# Patient Record
Sex: Female | Born: 1975
Health system: Southern US, Community
[De-identification: ages and names within clinical notes are randomized; demographics above are authoritative.]

## PROBLEM LIST (undated history)

## (undated) DIAGNOSIS — E079 Disorder of thyroid, unspecified: Secondary | ICD-10-CM

## (undated) HISTORY — DX: Gilbert syndrome: E80.4

## (undated) HISTORY — PX: SEPTOPLASTY: SUR1290

## (undated) HISTORY — DX: Disorder of thyroid, unspecified: E07.9

---

## 2002-12-12 HISTORY — PX: TUBAL LIGATION: SHX77

## 2003-12-09 ENCOUNTER — Ambulatory Visit (HOSPITAL_COMMUNITY): Admission: RE | Admit: 2003-12-09 | Discharge: 2003-12-09 | Payer: Self-pay | Admitting: *Deleted

## 2010-12-12 HISTORY — PX: LAMINECTOMY: SHX219

## 2016-02-09 DIAGNOSIS — N393 Stress incontinence (female) (male): Secondary | ICD-10-CM | POA: Insufficient documentation

## 2016-02-09 DIAGNOSIS — R002 Palpitations: Secondary | ICD-10-CM | POA: Insufficient documentation

## 2016-02-09 DIAGNOSIS — R51 Headache: Secondary | ICD-10-CM

## 2016-02-09 DIAGNOSIS — R519 Headache, unspecified: Secondary | ICD-10-CM | POA: Insufficient documentation

## 2016-12-26 DIAGNOSIS — M542 Cervicalgia: Secondary | ICD-10-CM | POA: Diagnosis not present

## 2016-12-26 DIAGNOSIS — G441 Vascular headache, not elsewhere classified: Secondary | ICD-10-CM | POA: Diagnosis not present

## 2016-12-26 DIAGNOSIS — M5030 Other cervical disc degeneration, unspecified cervical region: Secondary | ICD-10-CM | POA: Diagnosis not present

## 2016-12-26 DIAGNOSIS — M9901 Segmental and somatic dysfunction of cervical region: Secondary | ICD-10-CM | POA: Diagnosis not present

## 2017-01-02 DIAGNOSIS — R7309 Other abnormal glucose: Secondary | ICD-10-CM | POA: Diagnosis not present

## 2017-01-10 DIAGNOSIS — Z124 Encounter for screening for malignant neoplasm of cervix: Secondary | ICD-10-CM | POA: Diagnosis not present

## 2017-01-10 DIAGNOSIS — Z113 Encounter for screening for infections with a predominantly sexual mode of transmission: Secondary | ICD-10-CM | POA: Diagnosis not present

## 2017-01-10 DIAGNOSIS — Z6832 Body mass index (BMI) 32.0-32.9, adult: Secondary | ICD-10-CM | POA: Diagnosis not present

## 2017-01-10 DIAGNOSIS — N921 Excessive and frequent menstruation with irregular cycle: Secondary | ICD-10-CM | POA: Diagnosis not present

## 2017-01-10 DIAGNOSIS — Z1151 Encounter for screening for human papillomavirus (HPV): Secondary | ICD-10-CM | POA: Diagnosis not present

## 2017-01-10 DIAGNOSIS — Z01419 Encounter for gynecological examination (general) (routine) without abnormal findings: Secondary | ICD-10-CM | POA: Diagnosis not present

## 2017-01-12 DIAGNOSIS — G441 Vascular headache, not elsewhere classified: Secondary | ICD-10-CM | POA: Diagnosis not present

## 2017-01-12 DIAGNOSIS — M9901 Segmental and somatic dysfunction of cervical region: Secondary | ICD-10-CM | POA: Diagnosis not present

## 2017-01-12 DIAGNOSIS — M542 Cervicalgia: Secondary | ICD-10-CM | POA: Diagnosis not present

## 2017-01-12 DIAGNOSIS — M5030 Other cervical disc degeneration, unspecified cervical region: Secondary | ICD-10-CM | POA: Diagnosis not present

## 2017-01-19 DIAGNOSIS — M5030 Other cervical disc degeneration, unspecified cervical region: Secondary | ICD-10-CM | POA: Diagnosis not present

## 2017-01-19 DIAGNOSIS — M542 Cervicalgia: Secondary | ICD-10-CM | POA: Diagnosis not present

## 2017-01-19 DIAGNOSIS — M9901 Segmental and somatic dysfunction of cervical region: Secondary | ICD-10-CM | POA: Diagnosis not present

## 2017-01-19 DIAGNOSIS — G441 Vascular headache, not elsewhere classified: Secondary | ICD-10-CM | POA: Diagnosis not present

## 2017-01-26 DIAGNOSIS — G441 Vascular headache, not elsewhere classified: Secondary | ICD-10-CM | POA: Diagnosis not present

## 2017-01-26 DIAGNOSIS — M5030 Other cervical disc degeneration, unspecified cervical region: Secondary | ICD-10-CM | POA: Diagnosis not present

## 2017-01-26 DIAGNOSIS — M542 Cervicalgia: Secondary | ICD-10-CM | POA: Diagnosis not present

## 2017-01-26 DIAGNOSIS — M9901 Segmental and somatic dysfunction of cervical region: Secondary | ICD-10-CM | POA: Diagnosis not present

## 2017-01-30 DIAGNOSIS — M542 Cervicalgia: Secondary | ICD-10-CM | POA: Diagnosis not present

## 2017-01-30 DIAGNOSIS — M9901 Segmental and somatic dysfunction of cervical region: Secondary | ICD-10-CM | POA: Diagnosis not present

## 2017-01-30 DIAGNOSIS — M5030 Other cervical disc degeneration, unspecified cervical region: Secondary | ICD-10-CM | POA: Diagnosis not present

## 2017-01-30 DIAGNOSIS — G441 Vascular headache, not elsewhere classified: Secondary | ICD-10-CM | POA: Diagnosis not present

## 2017-02-09 DIAGNOSIS — M9901 Segmental and somatic dysfunction of cervical region: Secondary | ICD-10-CM | POA: Diagnosis not present

## 2017-02-09 DIAGNOSIS — G441 Vascular headache, not elsewhere classified: Secondary | ICD-10-CM | POA: Diagnosis not present

## 2017-02-09 DIAGNOSIS — M542 Cervicalgia: Secondary | ICD-10-CM | POA: Diagnosis not present

## 2017-02-09 DIAGNOSIS — M5030 Other cervical disc degeneration, unspecified cervical region: Secondary | ICD-10-CM | POA: Diagnosis not present

## 2017-02-17 DIAGNOSIS — G441 Vascular headache, not elsewhere classified: Secondary | ICD-10-CM | POA: Diagnosis not present

## 2017-02-17 DIAGNOSIS — M9901 Segmental and somatic dysfunction of cervical region: Secondary | ICD-10-CM | POA: Diagnosis not present

## 2017-02-17 DIAGNOSIS — M5030 Other cervical disc degeneration, unspecified cervical region: Secondary | ICD-10-CM | POA: Diagnosis not present

## 2017-02-17 DIAGNOSIS — M542 Cervicalgia: Secondary | ICD-10-CM | POA: Diagnosis not present

## 2017-02-28 DIAGNOSIS — G441 Vascular headache, not elsewhere classified: Secondary | ICD-10-CM | POA: Diagnosis not present

## 2017-02-28 DIAGNOSIS — M542 Cervicalgia: Secondary | ICD-10-CM | POA: Diagnosis not present

## 2017-02-28 DIAGNOSIS — M9901 Segmental and somatic dysfunction of cervical region: Secondary | ICD-10-CM | POA: Diagnosis not present

## 2017-02-28 DIAGNOSIS — M5030 Other cervical disc degeneration, unspecified cervical region: Secondary | ICD-10-CM | POA: Diagnosis not present

## 2017-03-14 DIAGNOSIS — M9901 Segmental and somatic dysfunction of cervical region: Secondary | ICD-10-CM | POA: Diagnosis not present

## 2017-03-14 DIAGNOSIS — M5030 Other cervical disc degeneration, unspecified cervical region: Secondary | ICD-10-CM | POA: Diagnosis not present

## 2017-03-14 DIAGNOSIS — M542 Cervicalgia: Secondary | ICD-10-CM | POA: Diagnosis not present

## 2017-03-14 DIAGNOSIS — G441 Vascular headache, not elsewhere classified: Secondary | ICD-10-CM | POA: Diagnosis not present

## 2017-03-27 DIAGNOSIS — M5030 Other cervical disc degeneration, unspecified cervical region: Secondary | ICD-10-CM | POA: Diagnosis not present

## 2017-03-27 DIAGNOSIS — M542 Cervicalgia: Secondary | ICD-10-CM | POA: Diagnosis not present

## 2017-03-27 DIAGNOSIS — M9901 Segmental and somatic dysfunction of cervical region: Secondary | ICD-10-CM | POA: Diagnosis not present

## 2017-03-27 DIAGNOSIS — G441 Vascular headache, not elsewhere classified: Secondary | ICD-10-CM | POA: Diagnosis not present

## 2017-04-06 DIAGNOSIS — M542 Cervicalgia: Secondary | ICD-10-CM | POA: Diagnosis not present

## 2017-04-06 DIAGNOSIS — G441 Vascular headache, not elsewhere classified: Secondary | ICD-10-CM | POA: Diagnosis not present

## 2017-04-06 DIAGNOSIS — M5030 Other cervical disc degeneration, unspecified cervical region: Secondary | ICD-10-CM | POA: Diagnosis not present

## 2017-04-06 DIAGNOSIS — M9901 Segmental and somatic dysfunction of cervical region: Secondary | ICD-10-CM | POA: Diagnosis not present

## 2017-04-21 DIAGNOSIS — M542 Cervicalgia: Secondary | ICD-10-CM | POA: Diagnosis not present

## 2017-04-21 DIAGNOSIS — M5030 Other cervical disc degeneration, unspecified cervical region: Secondary | ICD-10-CM | POA: Diagnosis not present

## 2017-04-21 DIAGNOSIS — G441 Vascular headache, not elsewhere classified: Secondary | ICD-10-CM | POA: Diagnosis not present

## 2017-04-21 DIAGNOSIS — M9901 Segmental and somatic dysfunction of cervical region: Secondary | ICD-10-CM | POA: Diagnosis not present

## 2017-05-09 DIAGNOSIS — G441 Vascular headache, not elsewhere classified: Secondary | ICD-10-CM | POA: Diagnosis not present

## 2017-05-09 DIAGNOSIS — M542 Cervicalgia: Secondary | ICD-10-CM | POA: Diagnosis not present

## 2017-05-09 DIAGNOSIS — M9901 Segmental and somatic dysfunction of cervical region: Secondary | ICD-10-CM | POA: Diagnosis not present

## 2017-05-09 DIAGNOSIS — M5030 Other cervical disc degeneration, unspecified cervical region: Secondary | ICD-10-CM | POA: Diagnosis not present

## 2017-05-22 DIAGNOSIS — M9901 Segmental and somatic dysfunction of cervical region: Secondary | ICD-10-CM | POA: Diagnosis not present

## 2017-05-22 DIAGNOSIS — M542 Cervicalgia: Secondary | ICD-10-CM | POA: Diagnosis not present

## 2017-05-22 DIAGNOSIS — M5030 Other cervical disc degeneration, unspecified cervical region: Secondary | ICD-10-CM | POA: Diagnosis not present

## 2017-05-22 DIAGNOSIS — G441 Vascular headache, not elsewhere classified: Secondary | ICD-10-CM | POA: Diagnosis not present

## 2017-07-12 DIAGNOSIS — R635 Abnormal weight gain: Secondary | ICD-10-CM | POA: Diagnosis not present

## 2017-07-12 DIAGNOSIS — Z0000 Encounter for general adult medical examination without abnormal findings: Secondary | ICD-10-CM | POA: Diagnosis not present

## 2017-07-12 DIAGNOSIS — Z0389 Encounter for observation for other suspected diseases and conditions ruled out: Secondary | ICD-10-CM | POA: Diagnosis not present

## 2017-07-12 DIAGNOSIS — R5383 Other fatigue: Secondary | ICD-10-CM | POA: Diagnosis not present

## 2017-07-12 DIAGNOSIS — E785 Hyperlipidemia, unspecified: Secondary | ICD-10-CM | POA: Diagnosis not present

## 2017-07-12 DIAGNOSIS — R61 Generalized hyperhidrosis: Secondary | ICD-10-CM | POA: Diagnosis not present

## 2017-07-12 DIAGNOSIS — E2839 Other primary ovarian failure: Secondary | ICD-10-CM | POA: Diagnosis not present

## 2017-08-01 DIAGNOSIS — R5383 Other fatigue: Secondary | ICD-10-CM | POA: Diagnosis not present

## 2017-08-01 DIAGNOSIS — E559 Vitamin D deficiency, unspecified: Secondary | ICD-10-CM | POA: Diagnosis not present

## 2017-08-01 DIAGNOSIS — E039 Hypothyroidism, unspecified: Secondary | ICD-10-CM | POA: Diagnosis not present

## 2017-08-01 DIAGNOSIS — R635 Abnormal weight gain: Secondary | ICD-10-CM | POA: Diagnosis not present

## 2017-09-12 DIAGNOSIS — E039 Hypothyroidism, unspecified: Secondary | ICD-10-CM | POA: Diagnosis not present

## 2017-09-12 DIAGNOSIS — R5383 Other fatigue: Secondary | ICD-10-CM | POA: Diagnosis not present

## 2017-09-12 DIAGNOSIS — E559 Vitamin D deficiency, unspecified: Secondary | ICD-10-CM | POA: Diagnosis not present

## 2017-09-18 DIAGNOSIS — N943 Premenstrual tension syndrome: Secondary | ICD-10-CM | POA: Diagnosis not present

## 2017-10-24 DIAGNOSIS — Z1231 Encounter for screening mammogram for malignant neoplasm of breast: Secondary | ICD-10-CM | POA: Diagnosis not present

## 2017-10-24 DIAGNOSIS — E039 Hypothyroidism, unspecified: Secondary | ICD-10-CM | POA: Diagnosis not present

## 2017-10-24 DIAGNOSIS — E559 Vitamin D deficiency, unspecified: Secondary | ICD-10-CM | POA: Diagnosis not present

## 2018-01-10 DIAGNOSIS — Z6835 Body mass index (BMI) 35.0-35.9, adult: Secondary | ICD-10-CM | POA: Diagnosis not present

## 2018-01-10 DIAGNOSIS — Z01419 Encounter for gynecological examination (general) (routine) without abnormal findings: Secondary | ICD-10-CM | POA: Diagnosis not present

## 2018-01-10 DIAGNOSIS — N393 Stress incontinence (female) (male): Secondary | ICD-10-CM | POA: Diagnosis not present

## 2018-01-23 DIAGNOSIS — N3941 Urge incontinence: Secondary | ICD-10-CM | POA: Diagnosis not present

## 2018-01-23 DIAGNOSIS — R3915 Urgency of urination: Secondary | ICD-10-CM | POA: Diagnosis not present

## 2018-01-23 DIAGNOSIS — N393 Stress incontinence (female) (male): Secondary | ICD-10-CM | POA: Diagnosis not present

## 2018-01-29 DIAGNOSIS — R5383 Other fatigue: Secondary | ICD-10-CM | POA: Diagnosis not present

## 2018-01-29 DIAGNOSIS — R635 Abnormal weight gain: Secondary | ICD-10-CM | POA: Diagnosis not present

## 2018-01-30 DIAGNOSIS — N3941 Urge incontinence: Secondary | ICD-10-CM | POA: Diagnosis not present

## 2018-01-30 DIAGNOSIS — R3915 Urgency of urination: Secondary | ICD-10-CM | POA: Diagnosis not present

## 2018-01-30 DIAGNOSIS — N393 Stress incontinence (female) (male): Secondary | ICD-10-CM | POA: Diagnosis not present

## 2018-03-06 DIAGNOSIS — J019 Acute sinusitis, unspecified: Secondary | ICD-10-CM | POA: Diagnosis not present

## 2018-03-06 DIAGNOSIS — E2839 Other primary ovarian failure: Secondary | ICD-10-CM | POA: Diagnosis not present

## 2018-03-06 DIAGNOSIS — R5383 Other fatigue: Secondary | ICD-10-CM | POA: Diagnosis not present

## 2018-03-06 DIAGNOSIS — R635 Abnormal weight gain: Secondary | ICD-10-CM | POA: Diagnosis not present

## 2018-03-06 DIAGNOSIS — E8881 Metabolic syndrome: Secondary | ICD-10-CM | POA: Diagnosis not present

## 2018-05-08 DIAGNOSIS — R5383 Other fatigue: Secondary | ICD-10-CM | POA: Diagnosis not present

## 2018-05-08 DIAGNOSIS — E669 Obesity, unspecified: Secondary | ICD-10-CM | POA: Diagnosis not present

## 2018-05-08 DIAGNOSIS — E8881 Metabolic syndrome: Secondary | ICD-10-CM | POA: Diagnosis not present

## 2018-05-08 DIAGNOSIS — E785 Hyperlipidemia, unspecified: Secondary | ICD-10-CM | POA: Diagnosis not present

## 2018-07-16 DIAGNOSIS — E669 Obesity, unspecified: Secondary | ICD-10-CM | POA: Diagnosis not present

## 2018-07-16 DIAGNOSIS — Z0000 Encounter for general adult medical examination without abnormal findings: Secondary | ICD-10-CM | POA: Diagnosis not present

## 2018-07-16 DIAGNOSIS — E559 Vitamin D deficiency, unspecified: Secondary | ICD-10-CM | POA: Diagnosis not present

## 2018-07-16 DIAGNOSIS — E2839 Other primary ovarian failure: Secondary | ICD-10-CM | POA: Diagnosis not present

## 2018-07-16 DIAGNOSIS — E8881 Metabolic syndrome: Secondary | ICD-10-CM | POA: Diagnosis not present

## 2018-07-16 DIAGNOSIS — E785 Hyperlipidemia, unspecified: Secondary | ICD-10-CM | POA: Diagnosis not present

## 2018-07-16 DIAGNOSIS — R5383 Other fatigue: Secondary | ICD-10-CM | POA: Diagnosis not present

## 2018-08-07 DIAGNOSIS — R5383 Other fatigue: Secondary | ICD-10-CM | POA: Diagnosis not present

## 2018-08-07 DIAGNOSIS — N649 Disorder of breast, unspecified: Secondary | ICD-10-CM | POA: Diagnosis not present

## 2018-10-16 DIAGNOSIS — R5383 Other fatigue: Secondary | ICD-10-CM | POA: Diagnosis not present

## 2018-10-16 DIAGNOSIS — E559 Vitamin D deficiency, unspecified: Secondary | ICD-10-CM | POA: Diagnosis not present

## 2018-10-16 DIAGNOSIS — E8881 Metabolic syndrome: Secondary | ICD-10-CM | POA: Diagnosis not present

## 2018-10-16 DIAGNOSIS — E669 Obesity, unspecified: Secondary | ICD-10-CM | POA: Diagnosis not present

## 2018-11-21 DIAGNOSIS — Z1231 Encounter for screening mammogram for malignant neoplasm of breast: Secondary | ICD-10-CM | POA: Diagnosis not present

## 2018-11-27 ENCOUNTER — Encounter: Payer: Self-pay | Admitting: Certified Nurse Midwife

## 2019-01-15 DIAGNOSIS — I10 Essential (primary) hypertension: Secondary | ICD-10-CM | POA: Diagnosis not present

## 2019-01-15 DIAGNOSIS — E559 Vitamin D deficiency, unspecified: Secondary | ICD-10-CM | POA: Diagnosis not present

## 2019-01-15 DIAGNOSIS — E2839 Other primary ovarian failure: Secondary | ICD-10-CM | POA: Diagnosis not present

## 2019-01-15 DIAGNOSIS — E785 Hyperlipidemia, unspecified: Secondary | ICD-10-CM | POA: Diagnosis not present

## 2019-01-15 DIAGNOSIS — Z713 Dietary counseling and surveillance: Secondary | ICD-10-CM | POA: Diagnosis not present

## 2019-01-15 DIAGNOSIS — E669 Obesity, unspecified: Secondary | ICD-10-CM | POA: Diagnosis not present

## 2019-01-15 DIAGNOSIS — R5383 Other fatigue: Secondary | ICD-10-CM | POA: Diagnosis not present

## 2019-01-16 DIAGNOSIS — Z6834 Body mass index (BMI) 34.0-34.9, adult: Secondary | ICD-10-CM | POA: Diagnosis not present

## 2019-01-16 DIAGNOSIS — Z01419 Encounter for gynecological examination (general) (routine) without abnormal findings: Secondary | ICD-10-CM | POA: Diagnosis not present

## 2019-02-04 DIAGNOSIS — J019 Acute sinusitis, unspecified: Secondary | ICD-10-CM | POA: Diagnosis not present

## 2019-02-25 ENCOUNTER — Ambulatory Visit (INDEPENDENT_AMBULATORY_CARE_PROVIDER_SITE_OTHER): Payer: BLUE CROSS/BLUE SHIELD

## 2019-02-25 ENCOUNTER — Encounter: Payer: Self-pay | Admitting: Orthopedic Surgery

## 2019-02-25 ENCOUNTER — Ambulatory Visit: Payer: BLUE CROSS/BLUE SHIELD | Admitting: Orthopedic Surgery

## 2019-02-25 ENCOUNTER — Other Ambulatory Visit: Payer: Self-pay

## 2019-02-25 VITALS — BP 120/60 | Ht 64.0 in | Wt 195.0 lb

## 2019-02-25 DIAGNOSIS — S838X1A Sprain of other specified parts of right knee, initial encounter: Secondary | ICD-10-CM | POA: Diagnosis not present

## 2019-02-25 DIAGNOSIS — M25561 Pain in right knee: Secondary | ICD-10-CM | POA: Diagnosis not present

## 2019-02-25 NOTE — Patient Instructions (Addendum)
Your insurer requires 6 weeks of non operative treatment before mri can be ordered.  I recommend that you continue ice and ibuprofen 800 mg 3 x a day  Decrease your activities We will order therapy  Meniscus Tear  A meniscus tear is a knee injury that happens when a piece of the meniscus is torn. The meniscus is a thick, rubbery, wedge-shaped cartilage in the knee. Two menisci are located in each knee. They sit between the upper bone (femur) and lower bone (tibia) that make up the knee joint. Each meniscus acts as a shock absorber for the knee. A torn meniscus is one of the most common types of knee injuries. This injury can range from mild to severe. Surgery may be needed to repair a severe tear. What are the causes? This condition may be caused by any kneeling, squatting, twisting, or pivoting movement. Sports-related injuries are the most common cause. These often occur from:  Running and stopping suddenly. ? Changing direction. ? Being tackled or knocked off your feet.  Lifting or carrying heavy weights. As people get older, their menisci get thinner and weaker. In these people, tears can happen more easily, such as from climbing stairs. What increases the risk? You are more likely to develop this condition if you:  Play contact sports.  Have a job that requires kneeling or squatting.  Are female.  Are over 32 years old. What are the signs or symptoms? Symptoms of this condition include:  Knee pain, especially at the side of the knee joint. You may feel pain when the injury occurs, or you may only hear a pop and feel pain later.  A feeling that your knee is clicking, catching, locking, or giving way.  Not being able to fully bend or extend your knee.  Bruising or swelling in your knee. How is this diagnosed? This condition may be diagnosed based on your symptoms and a physical exam. You may also have tests, such as:  X-rays.  MRI.  A procedure to look inside your knee  with a narrow surgical telescope (arthroscopy). You may be referred to a knee specialist (orthopedic surgeon). How is this treated? Treatment for this injury depends on the severity of the tear. Treatment for a mild tear may include:  Rest.  Medicine to reduce pain and swelling. This is usually a nonsteroidal anti-inflammatory drug (NSAID), like ibuprofen.  A knee brace, sleeve, or wrap.  Using crutches or a walker to keep weight off your knee and to help you walk.  Exercises to strengthen your knee (physical therapy). You may need surgery if you have a severe tear or if other treatments are not working. Follow these instructions at home: If you have a brace, sleeve, or wrap:  Wear it as told by your health care provider. Remove it only as told by your health care provider.  Loosen the brace, sleeve, or wrap if your toes tingle, become numb, or turn cold and blue.  Keep the brace, sleeve, or wrap clean and dry.  If the brace, sleeve, or wrap is not waterproof: ? Do not let it get wet. ? Cover it with a watertight covering when you take a bath or shower. Managing pain and swelling   Take over-the-counter and prescription medicines only as told by your health care provider.  If directed, put ice on your knee: ? If you have a removable brace, sleeve, or wrap, remove it as told by your health care provider. ? Put ice in a  plastic bag. ? Place a towel between your skin and the bag. ? Leave the ice on for 20 minutes, 2-3 times per day.  Move your toes often to avoid stiffness and to lessen swelling.  Raise (elevate) the injured area above the level of your heart while you are sitting or lying down. Activity  Do not use the injured limb to support your body weight until your health care provider says that you can. Use crutches or a walker as told by your health care provider.  Return to your normal activities as told by your health care provider. Ask your health care provider  what activities are safe for you.  Perform range-of-motion exercises only as told by your health care provider.  Begin doing exercises to strengthen your knee and leg muscles only as told by your health care provider. After you recover, your health care provider may recommend these exercises to help prevent another injury. General instructions  Use a knee brace, sleeve, or wrap as told by your health care provider.  Ask your health care provider when it is safe to drive if you have a brace, sleeve, or wrap on your knee.  Do not use any products that contain nicotine or tobacco, such as cigarettes, e-cigarettes, and chewing tobacco. If you need help quitting, ask your health care provider.  Ask your health care provider if the medicine prescribed to you: ? Requires you to avoid driving or using heavy machinery. ? Can cause constipation. You may need to take these actions to prevent or treat constipation:  Drink enough fluid to keep your urine pale yellow.  Take over-the-counter or prescription medicines.  Eat foods that are high in fiber, such as beans, whole grains, and fresh fruits and vegetables.  Limit foods that are high in fat and processed sugars, such as fried or sweet foods.  Keep all follow-up visits as told by your health care provider. This is important. Contact a health care provider if:  You have a fever.  Your knee becomes red, tender, or swollen.  Your pain medicine is not helping.  Your symptoms get worse or do not improve after 2 weeks of home care. Summary  A meniscus tear is a knee injury that happens when a piece of the meniscus is torn.  Treatment for this injury depends on the severity of the tear. You may need surgery if you have a severe tear or if other treatments are not working.  Rest, ice, and raise (elevate) your injured knee as told by your health care provider. This will help lessen pain and swelling.  Contact a health care provider if you  have new symptoms, or your symptoms get worse or do not improve after 2 weeks of home care.  Keep all follow-up visits as told by your health care provider. This is important. This information is not intended to replace advice given to you by your health care provider. Make sure you discuss any questions you have with your health care provider. Document Released: 02/18/2003 Document Revised: 06/12/2018 Document Reviewed: 06/12/2018 Elsevier Interactive Patient Education  2019 [employer].

## 2019-02-25 NOTE — Progress Notes (Signed)
NEW PATIENT OFFICE VISIT  Chief Complaint  Patient presents with  . Knee Pain    right knee pain for greater than 3 weeks     Right knee pain for 3 weeks Medial side right knee Dull ache  severity moderate Timing seems to hurt when going from sitting to standing and standing in 1 place for long time Did not notice any swelling  43 years old got in the shower right leg gave out 3 weeks ago she used ice and ibuprofen got a little bit better but not relieved      Review of Systems  Constitutional: Negative for chills and fever.  Respiratory: Negative for shortness of breath.   Cardiovascular: Negative for chest pain.  Neurological: Negative for tingling.     Past Medical History:  Diagnosis Date  . Thyroid disease     Past Surgical History:  Procedure Laterality Date  . LAMINECTOMY  2012  . TUBAL LIGATION  2004    Family History  Problem Relation Age of Onset  . Healthy Mother   . Healthy Father    Social History   Tobacco Use  . Smoking status: Not on file  Substance Use Topics  . Alcohol use: Not on file  . Drug use: Not on file    Allergies  Allergen Reactions  . Codeine Other (See Comments)    Dizziness, Light headed Unknown.     Current Meds  Medication Sig  . Cholecalciferol (VITAMIN D-1000 MAX ST) 25 MCG (1000 UT) tablet Frequency:   Dosage:0.0     Instructions:  Note:  . fexofenadine (ALLEGRA) 180 MG tablet Take by mouth.  . hydrochlorothiazide (HYDRODIURIL) 25 MG tablet Take 25 mg by mouth daily.  . MULTIPLE VITAMIN PO Frequency:   Dosage:0.0     Instructions:  Note:  . NP THYROID 15 MG tablet Take 15 mg by mouth daily.  . NP THYROID 90 MG tablet Take 90 mg by mouth daily.  . progesterone (PROMETRIUM) 200 MG capsule     BP 120/60   Ht 5\' 4"  (1.626 m)   Wt 195 lb (88.5 kg)   LMP 02/25/2019 (Exact Date)   BMI 33.47 kg/m   Physical Exam Vitals signs and nursing note reviewed.  Constitutional:      Appearance: Normal appearance.   Musculoskeletal:     Right knee: She exhibits no effusion.     Left knee: She exhibits no effusion.  Neurological:     Mental Status: She is alert and oriented to person, place, and time.  Psychiatric:        Mood and Affect: Mood normal.     Right Knee Exam   Muscle Strength  The patient has normal right knee strength.  Tenderness  The patient is experiencing tenderness in the medial joint line.  Range of Motion  Extension: normal  Flexion: normal   Tests  McMurray:  Medial - positive Lateral - negative Varus: negative Valgus: negative Drawer:  Anterior - negative    Posterior - negative  Other  Erythema: absent Scars: absent Sensation: normal Pulse: present Swelling: none Effusion: no effusion present   Left Knee Exam   Muscle Strength  The patient has normal left knee strength.  Tenderness  The patient is experiencing no tenderness.   Range of Motion  Extension: normal  Flexion: normal   Tests  McMurray:  Medial - negative Lateral - negative Varus: negative Valgus: negative Drawer:  Anterior - negative     Posterior -  negative  Other  Erythema: absent Scars: absent Sensation: normal Pulse: present Swelling: none Effusion: no effusion present        MEDICAL DECISION SECTION  3 views of the right knee no acute fracture dislocation or bone abnormality  Encounter Diagnoses  Name Primary?  . Acute pain of right knee Yes  . Acute medial meniscal injury of right knee, initial encounter     PLAN: (Rx., injectx, surgery, frx, mri/ct) nsaids  PT  Return 4 weeks  (per insurance, my pref is to do mri now)   No orders of the defined types were placed in this encounter.   Fuller Canada, MD  02/25/2019 11:30 AM

## 2019-02-28 ENCOUNTER — Other Ambulatory Visit: Payer: Self-pay

## 2019-02-28 ENCOUNTER — Ambulatory Visit (HOSPITAL_COMMUNITY): Payer: BLUE CROSS/BLUE SHIELD | Attending: Orthopedic Surgery | Admitting: Physical Therapy

## 2019-02-28 ENCOUNTER — Encounter (HOSPITAL_COMMUNITY): Payer: Self-pay | Admitting: Physical Therapy

## 2019-02-28 DIAGNOSIS — M25561 Pain in right knee: Secondary | ICD-10-CM | POA: Insufficient documentation

## 2019-02-28 DIAGNOSIS — R29898 Other symptoms and signs involving the musculoskeletal system: Secondary | ICD-10-CM | POA: Diagnosis not present

## 2019-02-28 DIAGNOSIS — M6281 Muscle weakness (generalized): Secondary | ICD-10-CM | POA: Insufficient documentation

## 2019-02-28 NOTE — Patient Instructions (Addendum)
Straight Leg Raise    Tighten stomach and slowly raise locked right leg __12__ inches from floor. Repeat _15___ times per set. Do _1___ sets per session. Do __1__ sessions per day.  http://orth.exer.us/1103   Copyright  VHI. All rights reserved.    Quad Set    With other leg bent, foot flat, slowly tighten muscles on thigh of straight leg while counting out loud to __3__. Repeat with other leg. Repeat _15___ times. Do __1__ sessions per day.  http://gt2.exer.us/276   Copyright  VHI. All rights reserved.    Access Code: [email or web address]  URL: [email or web address]  Date: 03/01/2019  Prepared by: Verne Carrow   Exercises Sidelying Hip Abduction - 15 reps - 2 sets - 2 hold - 1x daily - 7x weekly Prone Hip Extension - 15 reps - 2 sets - 2 hold - 1x daily - 7x weekly Straight Leg Raise with External Rotation - 15 reps - 2 sets - 2 hold - 1x daily - 7x weekly Standing 3-Way Kick - 10 reps - 3 sets - 1x daily - 7x weekly Standing 3-Way Kick - 10 reps - 3 sets - 5 hold - 1x daily - 7x weekly Standing Heel Raise - 15 reps - 2 sets - 3 hold - 1x daily - 7x weekly

## 2019-03-01 ENCOUNTER — Encounter (HOSPITAL_COMMUNITY): Payer: Self-pay | Admitting: Physical Therapy

## 2019-03-01 NOTE — Therapy (Addendum)
[facility] [address] [place], [place], [address] Phone: [phone]   Fax:  [phone]  Physical Therapy Evaluation  Patient Details  Name: Nonah Mattesriscilla D Woodfin MRN: [id number] Date of Birth: 42/01/1976 Referring Provider (PT): Fuller CanadaHarrison, Stanley MD   Encounter Date: 42/19/2020  PT End of Session - 02/28/19 1621    Visit Number  1    Number of Visits  8    Date for PT Re-Evaluation  04/12/19    Authorization Type  [employer] (60 visits combined PT/OT/SLP)    Authorization Time Period  02/28/19 - 04/12/19    Authorization - Visit Number  1    Authorization - Number of Visits  60    PT Start Time  1355    PT Stop Time  1430    PT Time Calculation (min)  35 min    Activity Tolerance  Patient tolerated treatment well    Behavior During Therapy  [facility] for tasks assessed/performed       Past Medical History:  Diagnosis Date  . Thyroid disease     Past Surgical History:  Procedure Laterality Date  . LAMINECTOMY  2012  . TUBAL LIGATION  2004    There were no vitals filed for this visit.   Subjective Assessment - 02/28/19 1358    Subjective  Patient reported that she fell and twisted her knee about 4 weeks ago when she slipped getting into the tub. She reported that if she straightens her knee or bends her knee that it hurts. She reported that if she changes positions it also irritates her knee. Patient denied any swelling. Patient denied any tingling or numbness. Patient reported that the maximum her pain has been is a 7/10.     Pertinent History  Fall onto her knee about 4 weeks ago    Limitations  Standing;House hold activities;Walking;Lifting    How long can you sit comfortably?  Not limited    How long can you stand comfortably?  15 minutes    How long can you walk comfortably?  15-20 minutes    Diagnostic tests  X-ray right knee normal 02/25/19    Patient Stated Goals  To not need surgery    Currently in Pain?  Yes    Pain Score  5      Pain Location  Knee    Pain Orientation  Right;Medial    Pain Descriptors / Indicators  Tightness    Pain Type  Acute pain    Pain Onset  1 to 4 weeks ago    Pain Frequency  Intermittent    Aggravating Factors   Transitions    Pain Relieving Factors  Medication    Effect of Pain on Daily Activities  Moderately affects                    Objective measurements completed on examination: See above findings.      02/28/19 0001  Assessment  Medical Diagnosis Acute Pain of Right Knee, Acute Medial Meniscal Injury of Right knee  Referring Provider (PT) Fuller CanadaHarrison, Stanley MD  Onset Date/Surgical Date 01/31/19  Next MD Visit 03/18/19  Prior Therapy None  Precautions  Precautions  (MD stated to not do resistance exercises )  Restrictions  Weight Bearing Restrictions No  Balance Screen  Has the patient fallen in the past 6 months Yes  How many times? 1  Has the patient had a decrease in activity level because of a fear of  falling?  Yes  Is the patient reluctant to leave their home because of a fear of falling?  No  Home Environment  Living Environment Private residence  Living Arrangements Children  Type of Home House  Home Access Level entry  Home Layout One level  Home Equipment None  Prior Function  Level of Independence Independent;Independent with basic ADLs  Cognition  Overall Cognitive Status Within Functional Limits for tasks assessed  Observation/Other Assessments  Focus on Therapeutic Outcomes (FOTO)  52% limited  Observation/Other Assessments-Edema   Edema Circumferential  Circumferential Edema  Circumferential - Right 16 inches  Circumferential - Left  16 inches  Sensation  Light Touch Appears Intact  ROM / Strength  AROM / PROM / Strength AROM;Strength  AROM  AROM Assessment Site Knee  Right/Left Knee Right;Left  Right Knee Extension 0  Right Knee Flexion 123  Left Knee Extension 0  Left Knee Flexion 133  Strength  Strength Assessment Site  Hip;Knee;Ankle  Right/Left Hip Right;Left  Right/Left Knee Right;Left  Right/Left Ankle Right;Left  Right Hip Flexion 4+/5  Right Hip Extension 4-/5  Right Hip ABduction 4+/5  Left Hip Flexion 5/5  Left Hip Extension 4/5  Left Hip ABduction 4+/5  Right Knee Flexion 4+/5  Right Knee Extension 4+/5  Left Knee Flexion 5/5  Left Knee Extension 5/5  Right Ankle Dorsiflexion 5/5  Left Ankle Dorsiflexion 5/5  Palpation  Palpation comment No reported tendereness to palpation around right knee  Special Tests   Special Tests Knee Special Tests  Knee Special tests  other  other   Comments All ligament testing negative; Thessaly's Test Positive on the right knee  Transfers  Five time sit to stand comments  Patient performed 5xSTS test in 19 seconds from standard height chair  Balance  Balance Assessed Yes  Static Standing Balance  Static Standing - Balance Support No upper extremity supported  Static Standing Balance -  Activities  Single Leg Stance - Right Leg;Single Leg Stance - Left Leg  Static Standing - Comment/# of Minutes Left 15 seconds; Right 20.73 seconds             PT Education - 02/28/19 1620    Education Details  Educated on examination findings, plan of care, and HEP.     Person(s) Educated  Patient    Methods  Explanation;Handout;Demonstration    Comprehension  Verbalized understanding;Returned demonstration       PT Short Term Goals - 02/28/19 1600      PT SHORT TERM GOAL #1   Title  Patient will report understanding and regular compliance with HEP to improve strength, ROM and overall functional mobility.     Time  2    Period  Weeks    Status  New    Target Date  03/29/19      PT SHORT TERM GOAL #2   Title  Patient will  report that her knee pain has not exceeded a 5/10 over the course of a 1 week period indicating improved tolerance to daily activities.     Time  2    Period  Weeks    Status  New    Target Date  03/29/19        PT Long Term  Goals - 02/28/19 1637      PT LONG TERM GOAL #1   Title  Patient will report that her knee pain has not exceeded a 2/10 over the course of a 1 week period indicating improved tolerance to  daily activities.     Time  4    Period  Weeks    Status  New    Target Date  04/12/19      PT LONG TERM GOAL #2   Title  Patient will demonstrate ability to perform the 5x STS test in no greater than 13 seconds indicating improved balance strength and overall functional mobility.     Time  6    Period  Weeks    Status  New    Target Date  04/12/19      PT LONG TERM GOAL #3   Title  Patient will demonstrate ability to maintain single limb stance for at least 30 seconds on each lower extremity indicating improved balance and stability in order to return to daily activities with improved mechanics and safety.     Time  4    Period  Weeks    Status  New    Target Date  04/12/19      PT LONG TERM GOAL #4   Title  Patient will demonstrate improvement of at least 1/2 MMT strength grade in all deficient muscle groups in order to improve mechanics with daily activities and decrease overall pain.     Time  4    Period  Weeks    Status  New    Target Date  04/12/19      PT LONG TERM GOAL #5   Title  Patient will demonstrate AROM of the right knee that is within 5 degrees of the left knee in order to improve patient's mechanics and ability to squat.     Time  4    Period  Weeks    Status  New    Target Date  04/12/19             Plan - 02/28/19 1700    Clinical Impression Statement  Patient is a 43 year old female who presented to outpatient physical therapy with primary complaint of right knee pain following a fall which occurred approximately 1 month ago. Upon examination, noted decreased strength primarily in the right lower extremity. In addition, noted decreased balance and stability with the 5xSTS test and with single limb stance. Patient was positive on the Thessaly's test so some  involvement of the meniscus is suspected at this time. Did educate the patient on initial HEP. Patient would benefit from continued skilled physical therapy in order to continue progressing towards functional goals. Plan to begin treatment sessions in 2 weeks as the outpatient clinic will be closed for the following 2 weeks due to current pandemic.     Personal Factors and Comorbidities  Other   Nature of injury   Examination-Activity Limitations  Locomotion Level;Squat;Transfers;Stairs    Examination-Participation Restrictions  Community Activity;Other   Exercising   Stability/Clinical Decision Making  Stable/Uncomplicated    Clinical Decision Making  Low    Rehab Potential  Good    PT Frequency  2x / week    PT Duration  4 weeks    PT Treatment/Interventions  ADLs/Self Care Home Management;Aquatic Therapy;Cryotherapy;Electrical Stimulation;Iontophoresis 4mg /ml Dexamethasone;Moist Heat;DME Instruction;Gait training;Stair training;Functional mobility training;Therapeutic activities;Therapeutic exercise;Balance training;Neuromuscular re-education;Patient/family education;Orthotic Fit/Training;Manual techniques;Passive range of motion;Dry needling;Energy conservation;Taping    PT Next Visit Plan  Ask Advance directives questions, review evaluation and goals, HEP compliance. Focus on improving right knee AROM, mat exercises for strengthening, progressing to functional lower extremity strengthening and balance.     PT Home Exercise Plan  02/28/19: SLR, Quad sets 1x/day. 02/28/19:  SLR, Quad sets 1x/day; Emailed 03/01/19: Sidelying Hip Abduction - 15 reps - 2 sets - 2 hold - 1x daily - 7x weekly   Consulted and Agree with Plan of Care  Patient       Patient will benefit from skilled therapeutic intervention in order to improve the following deficits and impairments:  Decreased balance, Decreased mobility, Difficulty walking, Decreased range of motion, Improper body mechanics, Decreased activity tolerance,  Decreased strength, Pain  Visit Diagnosis: Acute pain of right knee  Muscle weakness (generalized)  Other symptoms and signs involving the musculoskeletal system     Problem List Patient Active Problem List   Diagnosis Date Noted  . Generalized headaches 02/09/2016  . Palpitations 02/09/2016  . Urinary, incontinence, stress female 02/09/2016   Verne Carrow PT, DPT 11:16 AM, 03/01/19 [phone]  [facility] [address] [place], [place], [address] Phone: [phone]   Fax:  [phone]  Name: YARIS FERRELL MRN: [id number] Date of Birth: [birth year]

## 2019-03-05 ENCOUNTER — Encounter (HOSPITAL_COMMUNITY): Payer: BLUE CROSS/BLUE SHIELD | Admitting: Physical Therapy

## 2019-03-07 ENCOUNTER — Ambulatory Visit (HOSPITAL_COMMUNITY): Payer: BLUE CROSS/BLUE SHIELD | Admitting: Physical Therapy

## 2019-03-07 ENCOUNTER — Telehealth (HOSPITAL_COMMUNITY): Payer: Self-pay | Admitting: Physical Therapy

## 2019-03-07 NOTE — Telephone Encounter (Signed)
Therapist called to check-in on the patient. Patient reported she was doing well, but that she hadn't received the additional exercises the therapist had emailed. Therapist apologized and stated she would try sending them again. Therapist attempted again and then called patient to verify that the exercises had been received and patient reported that she had gotten them. Therapist stated that the patient should call with any additional questions or concerns.   Verne Carrow PT, DPT 9:49 AM, 03/07/19 [phone]

## 2019-03-12 ENCOUNTER — Encounter (HOSPITAL_COMMUNITY): Payer: BLUE CROSS/BLUE SHIELD | Admitting: Physical Therapy

## 2019-03-14 ENCOUNTER — Encounter (HOSPITAL_COMMUNITY): Payer: BLUE CROSS/BLUE SHIELD

## 2019-03-14 ENCOUNTER — Telehealth (HOSPITAL_COMMUNITY): Payer: Self-pay

## 2019-03-14 NOTE — Telephone Encounter (Signed)
Left voicemail informing pt that our clinic is cancelling all appointments until further notice due to COVID-19 in order to ensure pt and staff safety during this time. Informed pt that we are working to offer telehealth sessions and asked that she call back and let us know if she is interested in this. Also offered to call her weekly for HEP and status updates and asked that she call us back and let us know if she is interested in this or not as well.  Brooke Powell PT, DPT  

## 2019-03-15 ENCOUNTER — Telehealth (HOSPITAL_COMMUNITY): Payer: Self-pay | Admitting: Physical Therapy

## 2019-03-15 NOTE — Telephone Encounter (Signed)
Nichole Zamora was contacted today regarding temporary reduction of Outpatient Rehabilitation Services at [facility] due to concerns for community transmission of COVID-19.  Patient identity was verified.  Assessed if patient needed to be seen in person by clinician (recent fall or acute injury that requires hands on assessment and advice, change in diet order, post-surgical, special cases, etc.).    Patient did not have an acute/special need that requires in person visit. Proceeded with phone call.  Therapist advised the patient to continue to perform his/her HEP and assured he/she had no unanswered questions or concerns at this time.  The patient was offered and declined the continuation of their plan of care by using methods such as an E-Visit, virtual check in, or Telehealth visit.  Outpatient Rehabilitation Services at [facility] will follow up with this client when we are able to safely resume care at the clinic to all populations.  Patient is aware we can be reached by telephone during limited business hours in the meantime.   Verne Carrow PT, DPT 9:48 AM, 03/15/19 [phone]

## 2019-03-18 ENCOUNTER — Ambulatory Visit: Payer: BLUE CROSS/BLUE SHIELD | Admitting: Orthopedic Surgery

## 2019-03-19 ENCOUNTER — Encounter (HOSPITAL_COMMUNITY): Payer: BLUE CROSS/BLUE SHIELD | Admitting: Physical Therapy

## 2019-03-20 ENCOUNTER — Telehealth (HOSPITAL_COMMUNITY): Payer: Self-pay

## 2019-03-20 NOTE — Telephone Encounter (Signed)
Called for weekly check-in and pt had no questions or concerns and did not want any new HEP. PT inquired if she wanted [place] to continue to call weekly and she said no, she was fine for now. Encouraged her to call our office if she had any questions or concerns but otherwise, we would call her once our clinic reopens and she verbalized understanding.   Jac Canavan PT, DPT

## 2019-03-21 ENCOUNTER — Encounter (HOSPITAL_COMMUNITY): Payer: BLUE CROSS/BLUE SHIELD

## 2019-03-26 ENCOUNTER — Encounter (HOSPITAL_COMMUNITY): Payer: BLUE CROSS/BLUE SHIELD | Admitting: Physical Therapy

## 2019-03-28 ENCOUNTER — Encounter (HOSPITAL_COMMUNITY): Payer: BLUE CROSS/BLUE SHIELD | Admitting: Physical Therapy

## 2019-04-02 ENCOUNTER — Encounter (HOSPITAL_COMMUNITY): Payer: BLUE CROSS/BLUE SHIELD

## 2019-04-04 ENCOUNTER — Encounter (HOSPITAL_COMMUNITY): Payer: BLUE CROSS/BLUE SHIELD

## 2019-04-15 ENCOUNTER — Ambulatory Visit: Payer: Self-pay | Admitting: Orthopedic Surgery

## 2019-04-16 ENCOUNTER — Ambulatory Visit (INDEPENDENT_AMBULATORY_CARE_PROVIDER_SITE_OTHER): Payer: Self-pay | Admitting: Internal Medicine

## 2019-04-16 DIAGNOSIS — E559 Vitamin D deficiency, unspecified: Secondary | ICD-10-CM | POA: Diagnosis not present

## 2019-04-16 DIAGNOSIS — E8881 Metabolic syndrome: Secondary | ICD-10-CM | POA: Diagnosis not present

## 2019-04-16 DIAGNOSIS — E669 Obesity, unspecified: Secondary | ICD-10-CM | POA: Diagnosis not present

## 2019-04-16 DIAGNOSIS — R5383 Other fatigue: Secondary | ICD-10-CM | POA: Diagnosis not present

## 2019-04-23 ENCOUNTER — Telehealth (HOSPITAL_COMMUNITY): Payer: Self-pay

## 2019-04-23 NOTE — Telephone Encounter (Signed)
I called and spoke with Ms. Josephs and confirmed her identity. I informed her that our clinic is re-opening and that if she would like to resume care and have her Rt knee re-assessed we could schedule her for a visit. She stated she has been doing well on her own and now her knee is no longer hurting and is moving well. She would like to be discharged at this time.   Valentino Saxon, PT, DPT, [facility] Physical Therapist with [facility]  04/23/2019 1:30 PM

## 2019-05-24 ENCOUNTER — Encounter (HOSPITAL_COMMUNITY): Payer: Self-pay | Admitting: Physical Therapy

## 2019-05-24 NOTE — Therapy (Signed)
[place], [place], [address] Phone: [phone]   Fax:  [phone]  Patient Details  Name: Nichole Zamora MRN: [id number] Date of Birth: 03/30/1976 Referring Provider:  No ref. provider found  Encounter Date: 05/24/2019   PHYSICAL THERAPY DISCHARGE SUMMARY  Visits from Start of Care: 1  Current functional level related to goals / functional outcomes: Unknown as patient did not return for therapy for re-assessment. However patient reporting that she feels that she is functioning good.   Remaining deficits: Unknown as patient did not return for therapy for re-assessment. However patient reporting that she feels that she is functioning good.    Education / Equipment: HEP  Plan: Patient agrees to discharge.  Patient goals were not met. Patient is being discharged due to being pleased with the current functional level.  ?????         Clarene Critchley PT, DPT 4:42 PM, 05/24/19 [place], [place], [address] Phone: [phone]   Fax:  [phone]

## 2019-06-12 DIAGNOSIS — R5383 Other fatigue: Secondary | ICD-10-CM | POA: Diagnosis not present

## 2019-06-12 DIAGNOSIS — E2839 Other primary ovarian failure: Secondary | ICD-10-CM | POA: Diagnosis not present

## 2019-06-12 DIAGNOSIS — N943 Premenstrual tension syndrome: Secondary | ICD-10-CM | POA: Diagnosis not present

## 2019-06-12 DIAGNOSIS — E559 Vitamin D deficiency, unspecified: Secondary | ICD-10-CM | POA: Diagnosis not present

## 2019-06-12 DIAGNOSIS — R110 Nausea: Secondary | ICD-10-CM | POA: Diagnosis not present

## 2019-07-24 DIAGNOSIS — E559 Vitamin D deficiency, unspecified: Secondary | ICD-10-CM | POA: Diagnosis not present

## 2019-07-24 DIAGNOSIS — Z0000 Encounter for general adult medical examination without abnormal findings: Secondary | ICD-10-CM | POA: Diagnosis not present

## 2019-07-24 DIAGNOSIS — Z131 Encounter for screening for diabetes mellitus: Secondary | ICD-10-CM | POA: Diagnosis not present

## 2019-07-24 DIAGNOSIS — E669 Obesity, unspecified: Secondary | ICD-10-CM | POA: Diagnosis not present

## 2019-07-24 DIAGNOSIS — N943 Premenstrual tension syndrome: Secondary | ICD-10-CM | POA: Diagnosis not present

## 2019-07-24 DIAGNOSIS — E8881 Metabolic syndrome: Secondary | ICD-10-CM | POA: Diagnosis not present

## 2019-07-24 DIAGNOSIS — R5383 Other fatigue: Secondary | ICD-10-CM | POA: Diagnosis not present

## 2019-08-18 ENCOUNTER — Other Ambulatory Visit (INDEPENDENT_AMBULATORY_CARE_PROVIDER_SITE_OTHER): Payer: Self-pay | Admitting: Internal Medicine

## 2019-10-05 ENCOUNTER — Other Ambulatory Visit (INDEPENDENT_AMBULATORY_CARE_PROVIDER_SITE_OTHER): Payer: Self-pay | Admitting: Internal Medicine

## 2019-10-30 DIAGNOSIS — Z1231 Encounter for screening mammogram for malignant neoplasm of breast: Secondary | ICD-10-CM | POA: Diagnosis not present

## 2019-11-12 ENCOUNTER — Other Ambulatory Visit: Payer: Self-pay

## 2019-11-12 ENCOUNTER — Ambulatory Visit (INDEPENDENT_AMBULATORY_CARE_PROVIDER_SITE_OTHER): Payer: BLUE CROSS/BLUE SHIELD | Admitting: Internal Medicine

## 2019-11-12 ENCOUNTER — Encounter (INDEPENDENT_AMBULATORY_CARE_PROVIDER_SITE_OTHER): Payer: Self-pay | Admitting: Internal Medicine

## 2019-11-12 VITALS — BP 130/84 | HR 72 | Ht 64.0 in | Wt 200.6 lb

## 2019-11-12 DIAGNOSIS — E8881 Metabolic syndrome: Secondary | ICD-10-CM | POA: Diagnosis not present

## 2019-11-12 DIAGNOSIS — N943 Premenstrual tension syndrome: Secondary | ICD-10-CM

## 2019-11-12 DIAGNOSIS — E559 Vitamin D deficiency, unspecified: Secondary | ICD-10-CM

## 2019-11-12 DIAGNOSIS — E782 Mixed hyperlipidemia: Secondary | ICD-10-CM

## 2019-11-12 DIAGNOSIS — I10 Essential (primary) hypertension: Secondary | ICD-10-CM

## 2019-11-12 DIAGNOSIS — E785 Hyperlipidemia, unspecified: Secondary | ICD-10-CM

## 2019-11-12 DIAGNOSIS — E66811 Obesity, class 1: Secondary | ICD-10-CM

## 2019-11-12 DIAGNOSIS — Z9109 Other allergy status, other than to drugs and biological substances: Secondary | ICD-10-CM

## 2019-11-12 DIAGNOSIS — E669 Obesity, unspecified: Secondary | ICD-10-CM | POA: Insufficient documentation

## 2019-11-12 DIAGNOSIS — E88819 Insulin resistance, unspecified: Secondary | ICD-10-CM

## 2019-11-12 HISTORY — DX: Essential (primary) hypertension: I10

## 2019-11-12 HISTORY — DX: Obesity, unspecified: E66.9

## 2019-11-12 HISTORY — DX: Vitamin D deficiency, unspecified: E55.9

## 2019-11-12 HISTORY — DX: Obesity, class 1: E66.811

## 2019-11-12 HISTORY — DX: Premenstrual tension syndrome: N94.3

## 2019-11-12 HISTORY — DX: Hyperlipidemia, unspecified: E78.5

## 2019-11-12 NOTE — Patient Instructions (Signed)
[facility] Dietary Recommendations for Weight Loss What to Avoid . Avoid added sugars o Often added sugar can be found in processed foods such as many condiments, dry cereals, cakes, cookies, chips, crisps, crackers, candies, sweetened drinks, etc.  o Read labels and AVOID/DECREASE use of foods with the following in their ingredient list: Sugar, fructose, high fructose corn syrup, sucrose, glucose, maltose, dextrose, molasses, cane sugar, brown sugar, any type of syrup, agave nectar, etc.   . Avoid snacking in between meals . Avoid foods made with flour o If you are going to eat food made with flour, choose those made with whole-grains; and, minimize your consumption as much as is tolerable . Avoid processed foods o These foods are generally stocked in the middle of the grocery store. Focus on shopping on the perimeter of the grocery.  What to Include . Vegetables o GREEN LEAFY VEGETABLES: Kale, spinach, mustard greens, collard greens, cabbage, broccoli, etc. o OTHER: Asparagus, cauliflower, eggplant, carrots, peas, Brussel sprouts, tomatoes, bell peppers, zucchini, beets, cucumbers, etc. . Grains, seeds, and legumes o Beans: kidney beans, black eyed peas, garbanzo beans, black beans, pinto beans, etc. o Whole, unrefined grains: brown rice, barley, bulgur, oatmeal, etc. . Healthy fats  o Avoid highly processed fats such as vegetable oil o Examples of healthy fats: avocado, olives, virgin olive oil, dark chocolate (?72% Cocoa), nuts (peanuts, almonds, walnuts, cashews, pecans, etc.) . Low - Moderate Intake of Animal Sources of Protein o Meat sources: chicken, turkey, salmon, tuna. Limit to 4 ounces of meat at one time. o Consider limiting dairy sources, but when choosing dairy focus on: PLAIN Greek yogurt, cottage cheese, high-protein milk . Fruit o Choose berries  When to Eat . Intermittent Fasting: o Choosing not to eat for a specific time period, but DO FOCUS ON HYDRATION  when fasting o Multiple Techniques: - Time Restricted Eating: eat 3 meals in a day, each meal lasting no more than 60 minutes, no snacks between meals - 16-18 hour fast: fast for 16 to 18 hours up to 7 days a week. Often suggested to start with 2-3 nonconsecutive days per week.  . Remember the time you sleep is counted as fasting.  . Examples of eating schedule: Fast from 7:00pm-11:00am. Eat between 11:00am-7:00pm.  - 24-hour fast: fast for 24 hours up to every other day. Often suggested to start with 1 day per week . Remember the time you sleep is counted as fasting . Examples of eating schedule:  o Eating day: eat 2-3 meals on your eating day. If doing 2 meals, each meal should last no more than 90 minutes. If doing 3 meals, each meal should last no more than 60 minutes. Finish last meal by 7:00pm. o Fasting day: Fast until 7:00pm.  o IF YOU FEEL UNWELL FOR ANY REASON/IN ANY WAY WHEN FASTING, STOP FASTING BY EATING A NUTRITIOUS SNACK OR LIGHT MEAL o ALWAYS FOCUS ON HYDRATION DURING FASTS - Acceptable Hydration sources: water, broths, tea/coffee (black tea/coffee is best but using a small amount of whole-fat dairy products in coffee/tea is acceptable).  - Poor Hydration Sources: anything with sugar or artificial sweeteners added to it  These recommendations have been developed for patients that are actively receiving medical care from either Dr. Lorree Zamora or Nichole Gray, Nichole Zamora, Nichole Zamora at [facility]. These recommendations are developed for patients with specific medical conditions and are not meant to be distributed or used by others that are not actively receiving care from either provider listed   above at [facility]. It is not appropriate to participate in the above eating plans without proper medical supervision.   Reference: Fung, J. The obesity code. [place]: [place]; 2016.   

## 2019-11-12 NOTE — Progress Notes (Signed)
Metrics: Intervention Frequency ACO  Documented Smoking Status Yearly  Screened one or more times in 24 months  Cessation Counseling or  Active cessation medication Past 24 months  Past 24 months   Guideline developer: UpToDate (See UpToDate for funding source) Date Released: 2014       Wellness Office Visit  Subjective:  Patient ID: Nichole Zamora, female    DOB: December 02, 1976  Age: 43 y.o. MRN: [id number]  CC: This lady comes in for follow-up of hypertension, obesity, hyperlipidemia, premenstrual syndrome and vitamin D deficiency. HPI  She feels that she is more fatigued again.  Desiccated thyroid did seem to help her initially but she finds it towards the afternoon, she feels very tired and sleepy. She has gained some weight which she is unhappy about. She continues on hydrochlorothiazide for hypertension and seems to tolerate it.  Her potassium was always an issue and she did not tolerate prescription potassium tablets which are quite large to swallow but has been taking over-the-counter potassium supplementation. She continues on vitamin D3 supplementation for vitamin D deficiency.  Her levels were excellent last time measured at 101. She is also concerned about insulin resistance and her hemoglobin A1c 3 months ago to 4 months ago was in the normal range. She is also concerned about chronic postnasal drainage and she wonders whether she should see an allergist.  She has tried reflux medicine but this does not seem to help her.  Past Medical History:  Diagnosis Date  . Essential hypertension, benign 11/12/2019  . HLD (hyperlipidemia) 11/12/2019  . Obesity (BMI 30.0-34.9) 11/12/2019  . PMS (premenstrual syndrome) 11/12/2019  . Thyroid disease   . Vitamin D deficiency disease 11/12/2019      Family History  Problem Relation Age of Onset  . Healthy Mother   . Healthy Father     Social History   Social History Narrative   Divorced 19 years.Lives with her son.[employer] group.   Social History   Tobacco Use  . Smoking status: Never Smoker  . Smokeless tobacco: Never Used  Substance Use Topics  . Alcohol use: Not Currently    Current Meds  Medication Sig  . Bacillus Coagulans-Inulin (PROBIOTIC) 1-250 BILLION-MG CAPS Take 1 capsule by mouth daily.  . Cholecalciferol (VITAMIN D-3) 125 MCG (5000 UT) TABS Take 2 tablets by mouth daily.  . Cyanocobalamin (B-12) 2500 MCG TABS Take 1 tablet by mouth daily.  . fexofenadine (ALLEGRA) 180 MG tablet Take 180 mg by mouth daily.   . hydrochlorothiazide (HYDRODIURIL) 12.5 MG tablet Take 12.5 mg by mouth daily.   . NP THYROID 15 MG tablet Take 1 tablet by mouth once daily  . NP THYROID 90 MG tablet Take 1 tablet by mouth once daily  . Omega-3 Fatty Acids (OMEGA-3 FISH OIL PO) Take 3,200 mg by mouth daily.  . progesterone (PROMETRIUM) 200 MG capsule TAKE 1 CAPSULE BY MOUTH ONCE DAILY AT NIGHT  . vitamin E 400 UNIT capsule Take 400 Units by mouth daily.     Nutrition  Variable and not very consistent in November, she clearly wants to do better.  She finds it difficult with intermittent fasting as she tends to become quite cranky. Sleep  Uninterrupted sleep, adequate.  Exercise  None regular. Bio Identical Hormones  This patient is being treated with desiccated thyroid, off label, for symptoms of thyroid deficiency.  The patient has been counseled regarding side effects and how to deal with them.  Micronized progesterone is being used  in this patient for multiple benefits based on studies including protection against uterine cancer, breast cancer, osteoporosis and heart disease. The patient has been counseled regarding side effects, benefits and modes of administration. The patient is agreeable that this therapy is an integral part of her wellness, quality of life and prevention of chronic disease.  Objective:   Today's Vitals: BP 130/84   Pulse 72   Ht '5\' 4"'  (1.626 m)   Wt 200 lb 9.6 oz (91 kg)    BMI 34.43 kg/m  Vitals with BMI 11/12/2019 02/25/2019  Height '5\' 4"'  '5\' 4"'   Weight 200 lbs 10 oz 195 lbs  BMI 36.12 24.49  Systolic 753 005  Diastolic 84 60  Pulse 72 -     Physical Exam  She looks systemically well.  She has gained about 3 to 4 pounds since the last visit in August.  No other new physical findings.  Blood pressure slightly elevated compared to normal.  No new focal neurological signs.     Assessment   1. Insulin resistance   2. Essential hypertension, benign   3. PMS (premenstrual syndrome)   4. Vitamin D deficiency disease   5. Mixed hyperlipidemia   6. Obesity (BMI 30.0-34.9)   7. Environmental allergies       Tests ordered Orders Placed This Encounter  Procedures  . CMP with eGFR([facility])  . T3, Free  . TSH  . Hemoglobin A1c  . Ambulatory referral to Allergy     Plan: 1. Blood work is ordered as above. 2. I will refer her to an allergist for her symptoms of postnasal drainage and sore throat. 3. She will continue with antihypertensive medication as before. 4. We will check thyroid levels to see if we need to optimize this further. 5. She will continue with vitamin D3 supplementation. 6. Further recommendations will depend on blood results and I will see her in about 3 to 4 months time for follow-up.   No orders of the defined types were placed in this encounter.   Doree Albee, MD

## 2019-11-13 LAB — T3, FREE: T3, Free: 6.4 pg/mL — ABNORMAL HIGH (ref 2.3–4.2)

## 2019-11-13 LAB — COMPLETE METABOLIC PANEL WITH GFR
AG Ratio: 1.4 (calc) (ref 1.0–2.5)
ALT: 22 U/L (ref 6–29)
AST: 23 U/L (ref 10–30)
Albumin: 4.2 g/dL (ref 3.6–5.1)
Alkaline phosphatase (APISO): 65 U/L (ref 31–125)
BUN: 11 mg/dL (ref 7–25)
CO2: 25 mmol/L (ref 20–32)
Calcium: 9.8 mg/dL (ref 8.6–10.2)
Chloride: 99 mmol/L (ref 98–110)
Creat: 0.72 mg/dL (ref 0.50–1.10)
GFR, Est African American: 120 mL/min/{1.73_m2} (ref >=60)
GFR, Est Non African American: 103 mL/min/{1.73_m2} (ref >=60)
Globulin: 2.9 g/dL (calc) (ref 1.9–3.7)
Glucose, Bld: 98 mg/dL (ref 65–99)
Potassium: 3.6 mmol/L (ref 3.5–5.3)
Sodium: 139 mmol/L (ref 135–146)
Total Bilirubin: 1.2 mg/dL (ref 0.2–1.2)
Total Protein: 7.1 g/dL (ref 6.1–8.1)

## 2019-11-13 LAB — HEMOGLOBIN A1C
Hgb A1c MFr Bld: 5.5 % of total Hgb (ref <5.7)
Mean Plasma Glucose: 111 (calc)
eAG (mmol/L): 6.2 (calc)

## 2019-11-13 LAB — TSH: TSH: 0.01 mIU/L — ABNORMAL LOW

## 2019-11-16 ENCOUNTER — Encounter (INDEPENDENT_AMBULATORY_CARE_PROVIDER_SITE_OTHER): Payer: Self-pay | Admitting: Internal Medicine

## 2019-11-29 ENCOUNTER — Encounter (INDEPENDENT_AMBULATORY_CARE_PROVIDER_SITE_OTHER): Payer: Self-pay | Admitting: Internal Medicine

## 2019-12-04 ENCOUNTER — Other Ambulatory Visit: Payer: Self-pay

## 2019-12-04 ENCOUNTER — Ambulatory Visit: Payer: BC Managed Care – PPO | Admitting: Allergy & Immunology

## 2019-12-04 ENCOUNTER — Encounter: Payer: Self-pay | Admitting: Allergy & Immunology

## 2019-12-04 VITALS — BP 110/84 | HR 91 | Temp 98.6°F | Resp 18 | Ht 64.4 in | Wt 202.4 lb

## 2019-12-04 DIAGNOSIS — K9049 Malabsorption due to intolerance, not elsewhere classified: Secondary | ICD-10-CM | POA: Diagnosis not present

## 2019-12-04 DIAGNOSIS — J4520 Mild intermittent asthma, uncomplicated: Secondary | ICD-10-CM | POA: Diagnosis not present

## 2019-12-04 DIAGNOSIS — J302 Other seasonal allergic rhinitis: Secondary | ICD-10-CM

## 2019-12-04 DIAGNOSIS — J3089 Other allergic rhinitis: Secondary | ICD-10-CM

## 2019-12-04 MED ORDER — XHANCE 93 MCG/ACT NA EXHU: 2.0000 | INHALANT_SUSPENSION | Freq: Two times a day (BID) | NASAL | 5 refills | Status: DC

## 2019-12-04 MED ORDER — MONTELUKAST SODIUM 10 MG PO TABS: 10.0000 mg | ORAL_TABLET | Freq: Every day | ORAL | 5 refills | Status: DC

## 2019-12-04 MED ORDER — LEVOCETIRIZINE DIHYDROCHLORIDE 5 MG PO TABS: 5.0000 mg | ORAL_TABLET | Freq: Every evening | ORAL | 5 refills | Status: DC

## 2019-12-04 MED ORDER — AZELASTINE HCL 0.1 % NA SOLN: 2.0000 | Freq: Two times a day (BID) | NASAL | 5 refills | Status: DC

## 2019-12-04 NOTE — Progress Notes (Signed)
NEW PATIENT  Date of Service/Encounter:  12/06/19  Referring provider: Wilson Singer, MD   Assessment:   Mild intermittent asthma, uncomplicated  Perennial and seasonal allergic rhinitis (grasses, ragweed, weeds, trees, indoor molds, outdoor molds, cat, dog and cockroach)  Food intolerance  Plan/Recommendations:   1. Mild intermittent asthma, uncomplicated - Continue with albuterol as needed.  - There is no need for a controller medication.  2. Chronic rhinitis - Testing today showed: grasses, ragweed, weeds, trees, indoor molds, outdoor molds, cat, dog and cockroach - Copy of test results provided.  - Avoidance measures provided. - Stop taking: Allegra (fexofenadine) and Flonase - Start taking: Xyzal (levocetirizine)  tablet once daily, Singulair (montelukast)  daily, Xhance (fluticasone) 1-2 sprays per nostril twice daily and Astelin (azelastine) 2 sprays per nostril 1-2 times daily as needed  - Singulair can cause some moodiness/depression, so beware of that and stop the medication if that happens. - We will send in the script to the [facility] outpatient pharmacy, and they will call you to confirm your shipping address. - You can review how to use the device here: [email or web address].com - Ask to be enrolled in the auto-refill program so you can get a year for free. - You can use an extra dose of the antihistamine, if needed, for breakthrough symptoms.  - Consider nasal saline rinses 1-2 times daily to remove allergens from the nasal cavities as well as help with mucous clearance (this is especially helpful to do before the nasal sprays are given) - Consider allergy shots as a means of long-term control. - Allergy shots "re-train" and "reset" the immune system to ignore environmental allergens and decrease the resulting immune response to those allergens (sneezing, itchy watery eyes, runny nose, nasal congestion, etc).    - Allergy shots improve symptoms in 75-85% of  patients.  - We can discuss more at the next appointment if the medications are not working for you.  3. Concern for a food allergy - Testing to the most common foods was negative. - There is a the low positive predictive value of food allergy testing and hence the high possibility of false positives. - In contrast, food allergy testing has a high negative predictive value, therefore if testing is negative we can be relatively assured that they are indeed negative.   4. Return in about 3 months (around 03/03/2020). This can be an in-person, a virtual Webex or a telephone follow up visit.   Subjective:   Nichole Zamora is a 43 y.o. female presenting today for evaluation of  Chief Complaint  Patient presents with  . Allergies    Feels like mucous is running down throat all the time.   . Throat Clearing    with hoarseness on occasion     Nichole Zamora has a history of the following: Patient Active Problem List   Diagnosis Date Noted  . Essential hypertension, benign 11/12/2019  . PMS (premenstrual syndrome) 11/12/2019  . Vitamin D deficiency disease 11/12/2019  . HLD (hyperlipidemia) 11/12/2019  . Obesity (BMI 30.0-34.9) 11/12/2019  . Generalized headaches 02/09/2016  . Palpitations 02/09/2016  . Urinary, incontinence, stress female 02/09/2016    History obtained from: chart review and patient.  [name] was referred by Wilson Singer, MD.     [name] is a 43 y.o. female presenting for an evaluation of chronic rhinitis.  She has had these issues for approximately her entire life.  At this point, she reports congestion as well as  postnasal drip and throat clearing.  Symptoms occur throughout the year, but are definitely worse in the fall.  She denies any ocular symptoms.  She uses Allegra, which she takes daily for the last 15 to 17 years.  She has used Flonase on occasion, but never noticed much improvement.  She has been on another nasal steroid as well, maybe  Rhinocort. She does not use any other no sprays.  She has never been on Singulair.  She does get antibiotics around 1 time per year.  Typically she does not need prednisone. She has never been tested before today.  She does report some postnasal drip. This does not get worse with any particular environment, but does report that it gets worse with periods of eating. She has been treated with omeprazole BID, which she has taken for two months. There was no improvement with this at all. She has never seen GI before. She has tried taking wheat and cow's milk out of her diet without improvement in her symptoms.   She does have a rescue inhaler, but rarely uses it.  This was prescribed when she was diagnosed with bronchitis at one point.   Otherwise, there is no history of other atopic diseases, including food allergies, drug allergies, stinging insect allergies, eczema, urticaria or contact dermatitis. There is no significant infectious history. Vaccinations are up to date.    Past Medical History: Patient Active Problem List   Diagnosis Date Noted  . Essential hypertension, benign 11/12/2019  . PMS (premenstrual syndrome) 11/12/2019  . Vitamin D deficiency disease 11/12/2019  . HLD (hyperlipidemia) 11/12/2019  . Obesity (BMI 30.0-34.9) 11/12/2019  . Generalized headaches 02/09/2016  . Palpitations 02/09/2016  . Urinary, incontinence, stress female 02/09/2016    Medication List:  Allergies as of 12/04/2019      Reactions   Codeine Other (See Comments)   Dizziness, Light headed Unknown.      Medication List       Accurate as of December 04, 2019 11:59 PM. If you have any questions, ask your nurse or doctor.        azelastine 0.1 % nasal spray Commonly known as: ASTELIN Place 2 sprays into both nostrils 2 (two) times daily. Started by: Alfonse Spruce, MD   B-12 2500 MCG Tabs Take 1 tablet by mouth daily.   fexofenadine 180 MG tablet Commonly known as: ALLEGRA Take 180 mg  by mouth daily.   hydrochlorothiazide 12.5 MG tablet Commonly known as: HYDRODIURIL Take 12.5 mg by mouth daily.   levocetirizine 5 MG tablet Commonly known as: XYZAL Take 1 tablet (5 mg total) by mouth every evening. Started by: Alfonse Spruce, MD   montelukast 10 MG tablet Commonly known as: SINGULAIR Take 1 tablet (10 mg total) by mouth at bedtime. Started by: Alfonse Spruce, MD   NP Thyroid 15 MG tablet Generic drug: thyroid Take 1 tablet by mouth once daily   NP Thyroid 90 MG tablet Generic drug: thyroid Take 1 tablet by mouth once daily   OMEGA-3 FISH OIL PO Take 3,200 mg by mouth daily.   Probiotic 1-250 BILLION-MG Caps Take 1 capsule by mouth daily.   progesterone 200 MG capsule Commonly known as: PROMETRIUM TAKE 1 CAPSULE BY MOUTH ONCE DAILY AT NIGHT   Vitamin D-3 125 MCG (5000 UT) Tabs Take 2 tablets by mouth daily.   vitamin E 400 UNIT capsule Take 400 Units by mouth daily.   Xhance 93 MCG/ACT [facility]hu Generic drug: Fluticasone Propionate Place  2 sprays into the nose 2 (two) times daily. Started by: Alfonse Spruce, MD       Birth History: non-contributory  Developmental History: non-contributory  Past Surgical History: Past Surgical History:  Procedure Laterality Date  . LAMINECTOMY  2012  . TUBAL LIGATION  2004     Family History: Family History  Problem Relation Age of Onset  . Healthy Mother   . Healthy Father   . Allergic rhinitis Neg Hx   . Angioedema Neg Hx   . Asthma Neg Hx   . Atopy Neg Hx   . Eczema Neg Hx   . Immunodeficiency Neg Hx   . Urticaria Neg Hx      Social History: Kaliya lives at home with her family. There are no animals at home. There is no smoking exposure at all.    Review of Systems  Constitutional: Positive for chills. Negative for fever, malaise/fatigue and weight loss.  HENT: Positive for congestion, ear pain, sinus pain and sore throat. Negative for ear discharge.   Eyes: Negative  for pain, discharge and redness.  Respiratory: Negative for cough, sputum production, shortness of breath and wheezing.   Cardiovascular: Negative.  Negative for chest pain and palpitations.  Gastrointestinal: Negative for abdominal pain, constipation, diarrhea, heartburn, nausea and vomiting.  Skin: Negative.  Negative for itching and rash.  Neurological: Negative for dizziness and headaches.  Endo/Heme/Allergies: Negative for environmental allergies. Does not bruise/bleed easily.       Objective:   Blood pressure 110/84, pulse 91, temperature 98.6 F (37 C), temperature source Temporal, resp. rate 18, height 5' 4.4" (1.636 m), weight 202 lb 6.4 oz (91.8 kg), SpO2 98 %. Body mass index is 34.31 kg/m.   Physical Exam:   Physical Exam  Constitutional: She appears well-developed.  Pleasant female.   HENT:  Head: Normocephalic and atraumatic.  Right Ear: Tympanic membrane, external ear and ear canal normal. No drainage, swelling or tenderness. Tympanic membrane is not injected, not scarred, not erythematous, not retracted and not bulging.  Left Ear: Tympanic membrane, external ear and ear canal normal. No drainage, swelling or tenderness. Tympanic membrane is not injected, not scarred, not erythematous, not retracted and not bulging.  Nose: No mucosal edema, rhinorrhea, nasal deformity or septal deviation. No epistaxis. Right sinus exhibits no maxillary sinus tenderness and no frontal sinus tenderness. Left sinus exhibits no maxillary sinus tenderness and no frontal sinus tenderness.  Mouth/Throat: Uvula is midline and oropharynx is clear and moist. Mucous membranes are not pale and not dry.  Turbinates enlarged bilaterally without clear discharge. No epistaxis present. No polyps appreciated.   Eyes: Pupils are equal, round, and reactive to light. Conjunctivae and EOM are normal. Right eye exhibits no chemosis and no discharge. Left eye exhibits no chemosis and no discharge. Right  conjunctiva is not injected. Left conjunctiva is not injected.  Cardiovascular: Normal rate, regular rhythm and normal heart sounds.  Respiratory: Effort normal and breath sounds normal. No accessory muscle usage. No tachypnea. No respiratory distress. She has no wheezes. She has no rhonchi. She has no rales. She exhibits no tenderness.  Moving air well in all lung fields. No increased work of breathing noted.   GI: There is no abdominal tenderness. There is no rebound and no guarding.  Lymphadenopathy:       Head (right side): No submandibular, no tonsillar and no occipital adenopathy present.       Head (left side): No submandibular, no tonsillar and no occipital adenopathy present.  She has cervical adenopathy.       Right cervical: Superficial cervical adenopathy present.       Left cervical: Superficial cervical adenopathy present.  Neurological: She is alert.  Skin: No abrasion, no petechiae and no rash noted. Rash is not papular, not vesicular and not urticarial. No erythema. No pallor.  Psychiatric: She has a normal mood and affect.     Diagnostic studies:   Allergy Studies:    Airborne Adult Perc - 12/04/19 1414    Time Antigen Placed  1414    Allergen Manufacturer  Waynette ButteryGreer    Location  [facility]    Number of Test  60    Panel 1  Select    1. Control-Buffer 50% Glycerol  Negative    2. Control-Histamine 1 mg/ml  2+    3. Albumin saline  Negative    4. [place]  Negative    5. [place]  Negative    6. Johnson  2+    7. [place] Blue  2+    8. Meadow Fescue  2+    9. Perennial Rye  2+    10. Sweet Vernal  2+    11. Timothy  2+    12. Cocklebur  2+    13. Burweed [place]er  2+    14. Ragweed, short  4+    15. Ragweed, Giant  2+    16. Plantain,  English  Negative    17. Lamb's Quarters  Negative    18. Sheep Sorrell  2+    19. Rough Pigweed  Negative    20. Marsh Elder, Rough  2+    21. Mugwort, Common  2+    22. Ash mix  Negative    23. Birch mix  Negative    24. Beech  American  Negative    25. Box, Elder  2+    26. Cedar, red  Negative    27. Cottonwood, [place]  Negative    28. Elm mix  Negative    29. Hickory mix  Negative    30. Maple mix  2+    31. Oak, [place] mix  Negative    32. Pecan Pollen  Negative    33. Pine mix  Negative    34. Sycamore Eastern  Negative    35. Walnut, Black Pollen  Negative    36. Alternaria alternata  Negative    37. Cladosporium Herbarum  Negative    38. Aspergillus mix  Negative    39. Penicillium mix  Negative    40. Bipolaris sorokiniana (Helminthosporium)  Negative    41. Drechslera spicifera (Curvularia)  Negative    42. Mucor plumbeus  Negative    43. Fusarium moniliforme  Negative    44. Aureobasidium pullulans (pullulara)  Negative    45. Rhizopus oryzae  Negative    46. Botrytis cinera  Negative    47. Epicoccum nigrum  Negative    48. Phoma betae  Negative    49. Candida Albicans  Negative    50. Trichophyton mentagrophytes  Negative    51. Mite, D Farinae  5,000 AU/ml  Negative    52. Mite, D Pteronyssinus  5,000 AU/ml  Negative    53. Cat Hair 10,000 BAU/ml  2+    54.  Dog Epithelia  2+    55. Mixed Feathers  Negative    56. Horse Epithelia  Negative    57. Cockroach, German  Negative    58. Mouse  Negative  59. Tobacco Leaf  Negative    Other  Negative   [place] Pig    Food Perc - 12/04/19 1414    Allergen Manufacturer  Lavella Hammock    Location  Back    Number of allergen test  10    Food  Select    1. Peanut  Negative    2. Soybean food  Negative    3. Wheat, whole  Negative    4. Sesame  Negative    5. Milk, cow  Negative    6. Egg White, chicken  Negative    7. Casein  Negative    8. Shellfish mix  Negative    9. Fish mix  Negative    10. Cashew  Negative     Intradermal - 12/04/19 1455    Time Antigen Placed  1455    Allergen Manufacturer  Lavella Hammock    Location  Arm    Number of Test  8    Intradermal  Select    Control  Negative    [place]  3+    Mold 1  1+    Mold 2  1+     Mold 3  Negative    Mold 4  2+    Cockroach  2+    Mite mix  Negative       Allergy testing results were read and interpreted by myself, documented by clinical staff.         Salvatore Marvel, MD [facility] [place] [facility]

## 2019-12-04 NOTE — Patient Instructions (Addendum)
1. Mild intermittent asthma, uncomplicated - Continue with albuterol as needed.  - There is no need for a controller medication.  2. Chronic rhinitis - Testing today showed: grasses, ragweed, weeds, trees, indoor molds, outdoor molds, cat, dog and cockroach - Copy of test results provided.  - Avoidance measures provided. - Stop taking: Allegra (fexofenadine) and Flonase - Start taking: Xyzal (levocetirizine)  tablet once daily, Singulair (montelukast)  daily, Xhance (fluticasone) 1-2 sprays per nostril twice daily and Astelin (azelastine) 2 sprays per nostril 1-2 times daily as needed  - Singulair can cause some moodiness/depression, so beware of that and stop the medication if that happens. - We will send in the script to the [facility] outpatient pharmacy, and they will call you to confirm your shipping address. - You can review how to use the device here: https://www.xhance.com - Ask to be enrolled in the auto-refill program so you can get a year for free. - You can use an extra dose of the antihistamine, if needed, for breakthrough symptoms.  - Consider nasal saline rinses 1-2 times daily to remove allergens from the nasal cavities as well as help with mucous clearance (this is especially helpful to do before the nasal sprays are given) - Consider allergy shots as a means of long-term control. - Allergy shots "re-train" and "reset" the immune system to ignore environmental allergens and decrease the resulting immune response to those allergens (sneezing, itchy watery eyes, runny nose, nasal congestion, etc).    - Allergy shots improve symptoms in 75-85% of patients.  - We can discuss more at the next appointment if the medications are not working for you.  3. Concern for a food allergy - Testing to the most common foods was negative. - There is a the low positive predictive value of food allergy testing and hence the high possibility of false positives. - In contrast, food allergy  testing has a high negative predictive value, therefore if testing is negative we can be relatively assured that they are indeed negative.   4. Return in about 3 months (around 03/03/2020). This can be an in-person, a virtual Webex or a telephone follow up visit.   Please inform us of any Emergency Department visits, hospitalizations, or changes in symptoms. Call us before going to the ED for breathing or allergy symptoms since we might be able to fit you in for a sick visit. Feel free to contact us anytime with any questions, problems, or concerns.  It was a pleasure to meet you today!  Websites that have reliable patient information: 1. American Academy of Asthma, Allergy, and Immunology: www.aaaai.org 2. Food Allergy Research and Education (FARE): foodallergy.org 3. Mothers of Asthmatics: http://www.asthmacommunitynetwork.org 4. American College of Allergy, Asthma, and Immunology: www.acaai.org  "Like" [place] on Facebook and Instagram for our latest updates!        Make sure you are registered to vote! If you have moved or changed any of your contact information, you will need to get this updated before voting!  In some cases, you MAY be able to register to vote online: [email or web address]     Reducing Pollen Exposure  The American Academy of Allergy, Asthma and Immunology suggests the following steps to reduce your exposure to pollen during allergy seasons.    1. Do not hang sheets or clothing out to dry; pollen may collect on these items. 2. Do not mow lawns or spend time around freshly cut grass; mowing stirs up pollen. 3. Keep windows closed at night.  Keep  car windows closed while driving. 4. Minimize morning activities outdoors, a time when pollen counts are usually at their highest. 5. Stay indoors as much as possible when pollen counts or humidity is high and on windy days when pollen tends to remain in the air longer. 6. Use air conditioning  when possible.  Many air conditioners have filters that trap the pollen spores. 7. Use a HEPA room air filter to remove pollen form the indoor air you breathe.  Control of Mold Allergen   Mold and fungi can grow on a variety of surfaces provided certain temperature and moisture conditions exist.  Outdoor molds grow on plants, decaying vegetation and soil.  The major outdoor mold, Alternaria and Cladosporium, are found in very high numbers during hot and dry conditions.  Generally, a late Summer - Fall peak is seen for common outdoor fungal spores.  Rain will temporarily lower outdoor mold spore count, but counts rise rapidly when the rainy period ends.  The most important indoor molds are Aspergillus and Penicillium.  Dark, humid and poorly ventilated basements are ideal sites for mold growth.  The next most common sites of mold growth are the bathroom and the kitchen.  Outdoor (Seasonal) Mold Control  Positive outdoor molds via skin testing: Alternaria and Cladosporium  1. Use air conditioning and keep windows closed 2. Avoid exposure to decaying vegetation. 3. Avoid leaf raking. 4. Avoid grain handling. 5. Consider wearing a face mask if working in moldy areas.  6.   Indoor (Perennial) Mold Control   Positive indoor molds via skin testing: Aspergillus, Penicillium, Fusarium, Aureobasidium (Pullulara) and Rhizopus  1. Maintain humidity below 50%. 2. Clean washable surfaces with 5% bleach solution. 3. Remove sources e.g. contaminated carpets.     Control of Dog or Cat Allergen  Avoidance is the best way to manage a dog or cat allergy. If you have a dog or cat and are allergic to dog or cats, consider removing the dog or cat from the home. If you have a dog or cat but don't want to find it a new home, or if your family wants a pet even though someone in the household is allergic, here are some strategies that may help keep symptoms at bay:  1. Keep the pet out of your bedroom and  restrict it to only a few rooms. Be advised that keeping the dog or cat in only one room will not limit the allergens to that room. 2. Don't pet, hug or kiss the dog or cat; if you do, wash your hands with soap and water. 3. High-efficiency particulate air (HEPA) cleaners run continuously in a bedroom or living room can reduce allergen levels over time. 4. Regular use of a high-efficiency vacuum cleaner or a central vacuum can reduce allergen levels. 5. Giving your dog or cat a bath at least once a week can reduce airborne allergen.  Control of Cockroach Allergen  Cockroach allergen has been identified as an important cause of acute attacks of asthma, especially in urban settings.  There are fifty-five species of cockroach that exist in the [place], however only three, the [place], [place] and Oriental species produce allergen that can affect patients with Asthma.  Allergens can be obtained from fecal particles, egg casings and secretions from cockroaches.    1. Remove food sources. 2. Reduce access to water. 3. Seal access and entry points. 4. Spray runways with 0.5-1% Diazinon or Chlorpyrifos 5. Blow boric acid power under stoves and refrigerator. 6.  Place bait stations (hydramethylnon) at feeding sites.  Allergy Shots   Allergies are the result of a chain reaction that starts in the immune system. Your immune system controls how your body defends itself. For instance, if you have an allergy to pollen, your immune system identifies pollen as an invader or allergen. Your immune system overreacts by producing antibodies called Immunoglobulin E (IgE). These antibodies travel to cells that release chemicals, causing an allergic reaction.  The concept behind allergy immunotherapy, whether it is received in the form of shots or tablets, is that the immune system can be desensitized to specific allergens that trigger allergy symptoms. Although it requires time and patience, the payback can be  long-term relief.  How Do Allergy Shots Work?  Allergy shots work much like a vaccine. Your body responds to injected amounts of a particular allergen given in increasing doses, eventually developing a resistance and tolerance to it. Allergy shots can lead to decreased, minimal or no allergy symptoms.  There generally are two phases: build-up and maintenance. Build-up often ranges from three to six months and involves receiving injections with increasing amounts of the allergens. The shots are typically given once or twice a week, though more rapid build-up schedules are sometimes used.  The maintenance phase begins when the most effective dose is reached. This dose is different for each person, depending on how allergic you are and your response to the build-up injections. Once the maintenance dose is reached, there are longer periods between injections, typically two to four weeks.  Occasionally doctors give cortisone-type shots that can temporarily reduce allergy symptoms. These types of shots are different and should not be confused with allergy immunotherapy shots.  Who Can Be Treated with Allergy Shots?  Allergy shots may be a good treatment approach for people with allergic rhinitis (hay fever), allergic asthma, conjunctivitis (eye allergy) or stinging insect allergy.   Before deciding to begin allergy shots, you should consider:  . The length of allergy season and the severity of your symptoms . Whether medications and/or changes to your environment can control your symptoms . Your desire to avoid long-term medication use . Time: allergy immunotherapy requires a major time commitment . Cost: may vary depending on your insurance coverage  Allergy shots for children age 21 and older are effective and often well tolerated. They might prevent the onset of new allergen sensitivities or the progression to asthma.  Allergy shots are not started on patients who are pregnant but can be  continued on patients who become pregnant while receiving them. In some patients with other medical conditions or who take certain common medications, allergy shots may be of risk. It is important to mention other medications you talk to your allergist.   When Will I Feel Better?  Some may experience decreased allergy symptoms during the build-up phase. For others, it may take as long as 12 months on the maintenance dose. If there is no improvement after a year of maintenance, your allergist will discuss other treatment options with you.  If you aren't responding to allergy shots, it may be because there is not enough dose of the allergen in your vaccine or there are missing allergens that were not identified during your allergy testing. Other reasons could be that there are high levels of the allergen in your environment or major exposure to non-allergic triggers like tobacco smoke.  What Is the Length of Treatment?  Once the maintenance dose is reached, allergy shots are generally continued for  three to five years. The decision to stop should be discussed with your allergist at that time. Some people may experience a permanent reduction of allergy symptoms. Others may relapse and a longer course of allergy shots can be considered.  What Are the Possible Reactions?  The two types of adverse reactions that can occur with allergy shots are local and systemic. Common local reactions include very mild redness and swelling at the injection site, which can happen immediately or several hours after. A systemic reaction, which is less common, affects the entire body or a particular body system. They are usually mild and typically respond quickly to medications. Signs include increased allergy symptoms such as sneezing, a stuffy nose or hives.  Rarely, a serious systemic reaction called anaphylaxis can develop. Symptoms include swelling in the throat, wheezing, a feeling of tightness in the chest, nausea or  dizziness. Most serious systemic reactions develop within 30 minutes of allergy shots. This is why it is strongly recommended you wait in your doctor's office for 30 minutes after your injections. Your allergist is trained to watch for reactions, and his or her staff is trained and equipped with the proper medications to identify and treat them.  Who Should Administer Allergy Shots?  The preferred location for receiving shots is your prescribing allergist's office. Injections can sometimes be given at another facility where the physician and staff are trained to recognize and treat reactions, and have received instructions by your prescribing allergist.

## 2019-12-09 ENCOUNTER — Other Ambulatory Visit (INDEPENDENT_AMBULATORY_CARE_PROVIDER_SITE_OTHER): Payer: Self-pay | Admitting: Internal Medicine

## 2019-12-18 ENCOUNTER — Telehealth: Payer: Self-pay

## 2019-12-18 NOTE — Telephone Encounter (Signed)
Prior authorization for Xhance nasal spray has been approved and faxed to the pharmacy. Approval information as follows:  CaseId:[id number];Status:Approved;Review Type:Prior Auth;Coverage Start Date:11/18/2019;Coverage End Date:12/17/2020;

## 2019-12-19 ENCOUNTER — Other Ambulatory Visit (INDEPENDENT_AMBULATORY_CARE_PROVIDER_SITE_OTHER): Payer: Self-pay | Admitting: Internal Medicine

## 2019-12-19 ENCOUNTER — Encounter (INDEPENDENT_AMBULATORY_CARE_PROVIDER_SITE_OTHER): Payer: Self-pay | Admitting: Internal Medicine

## 2019-12-19 MED ORDER — THYROID 120 MG PO TABS: 120.0000 mg | ORAL_TABLET | Freq: Every day | ORAL | 3 refills | Status: DC

## 2019-12-20 ENCOUNTER — Encounter: Payer: Self-pay | Admitting: Allergy & Immunology

## 2019-12-31 DIAGNOSIS — R7303 Prediabetes: Secondary | ICD-10-CM | POA: Diagnosis not present

## 2019-12-31 DIAGNOSIS — R5383 Other fatigue: Secondary | ICD-10-CM | POA: Diagnosis not present

## 2019-12-31 DIAGNOSIS — N951 Menopausal and female climacteric states: Secondary | ICD-10-CM | POA: Diagnosis not present

## 2019-12-31 DIAGNOSIS — E039 Hypothyroidism, unspecified: Secondary | ICD-10-CM | POA: Diagnosis not present

## 2019-12-31 DIAGNOSIS — R635 Abnormal weight gain: Secondary | ICD-10-CM | POA: Diagnosis not present

## 2020-01-07 DIAGNOSIS — Z1331 Encounter for screening for depression: Secondary | ICD-10-CM | POA: Diagnosis not present

## 2020-01-07 DIAGNOSIS — Z1339 Encounter for screening examination for other mental health and behavioral disorders: Secondary | ICD-10-CM | POA: Diagnosis not present

## 2020-01-07 DIAGNOSIS — N951 Menopausal and female climacteric states: Secondary | ICD-10-CM | POA: Diagnosis not present

## 2020-01-07 DIAGNOSIS — E039 Hypothyroidism, unspecified: Secondary | ICD-10-CM | POA: Diagnosis not present

## 2020-01-07 DIAGNOSIS — E7800 Pure hypercholesterolemia, unspecified: Secondary | ICD-10-CM | POA: Diagnosis not present

## 2020-01-07 DIAGNOSIS — R7303 Prediabetes: Secondary | ICD-10-CM | POA: Diagnosis not present

## 2020-01-14 DIAGNOSIS — R7303 Prediabetes: Secondary | ICD-10-CM | POA: Diagnosis not present

## 2020-01-14 DIAGNOSIS — Z6833 Body mass index (BMI) 33.0-33.9, adult: Secondary | ICD-10-CM | POA: Diagnosis not present

## 2020-01-17 NOTE — Addendum Note (Signed)
Addended by: Alfonse Spruce on: 01/17/2020 04:07 PM   Modules accepted: Orders

## 2020-01-20 DIAGNOSIS — E039 Hypothyroidism, unspecified: Secondary | ICD-10-CM | POA: Insufficient documentation

## 2020-01-20 DIAGNOSIS — Z01419 Encounter for gynecological examination (general) (routine) without abnormal findings: Secondary | ICD-10-CM | POA: Diagnosis not present

## 2020-01-20 DIAGNOSIS — Z6834 Body mass index (BMI) 34.0-34.9, adult: Secondary | ICD-10-CM | POA: Diagnosis not present

## 2020-01-20 DIAGNOSIS — J3081 Allergic rhinitis due to animal (cat) (dog) hair and dander: Secondary | ICD-10-CM | POA: Diagnosis not present

## 2020-01-20 NOTE — Progress Notes (Signed)
VIALS EXP 01-19-21 

## 2020-01-21 DIAGNOSIS — Z6833 Body mass index (BMI) 33.0-33.9, adult: Secondary | ICD-10-CM | POA: Diagnosis not present

## 2020-01-21 DIAGNOSIS — E7800 Pure hypercholesterolemia, unspecified: Secondary | ICD-10-CM | POA: Diagnosis not present

## 2020-01-21 DIAGNOSIS — J301 Allergic rhinitis due to pollen: Secondary | ICD-10-CM | POA: Diagnosis not present

## 2020-01-28 DIAGNOSIS — R7303 Prediabetes: Secondary | ICD-10-CM | POA: Diagnosis not present

## 2020-01-28 DIAGNOSIS — Z6833 Body mass index (BMI) 33.0-33.9, adult: Secondary | ICD-10-CM | POA: Diagnosis not present

## 2020-01-29 ENCOUNTER — Other Ambulatory Visit: Payer: Self-pay

## 2020-01-29 ENCOUNTER — Ambulatory Visit (INDEPENDENT_AMBULATORY_CARE_PROVIDER_SITE_OTHER): Payer: BC Managed Care – PPO

## 2020-01-29 DIAGNOSIS — J309 Allergic rhinitis, unspecified: Secondary | ICD-10-CM

## 2020-01-29 MED ORDER — EPINEPHRINE 0.3 MG/0.3ML IJ SOAJ: 0.3000 mg | Freq: Once | INTRAMUSCULAR | 1 refills | Status: AC

## 2020-01-29 NOTE — Progress Notes (Signed)
Immunotherapy   Patient Details  Name: Nichole Zamora MRN: [id number] Date of Birth: 1976/08/20  01/29/2020  Nichole Zamora started allergy injections today, didn't take antihistamine, waited 30 minutes with no reaction.  Following schedule: B  Frequency:1-2 times weekly Epi-Pen:Will put script into pharmacy  Consent signed and patient instructions given.   Florence Canner 01/29/2020, 3:12 PM

## 2020-01-31 ENCOUNTER — Ambulatory Visit (INDEPENDENT_AMBULATORY_CARE_PROVIDER_SITE_OTHER): Payer: BC Managed Care – PPO

## 2020-01-31 DIAGNOSIS — J309 Allergic rhinitis, unspecified: Secondary | ICD-10-CM

## 2020-02-04 DIAGNOSIS — Z6833 Body mass index (BMI) 33.0-33.9, adult: Secondary | ICD-10-CM | POA: Diagnosis not present

## 2020-02-04 DIAGNOSIS — E7800 Pure hypercholesterolemia, unspecified: Secondary | ICD-10-CM | POA: Diagnosis not present

## 2020-02-04 DIAGNOSIS — E039 Hypothyroidism, unspecified: Secondary | ICD-10-CM | POA: Diagnosis not present

## 2020-02-05 ENCOUNTER — Ambulatory Visit (INDEPENDENT_AMBULATORY_CARE_PROVIDER_SITE_OTHER): Payer: BC Managed Care – PPO

## 2020-02-05 DIAGNOSIS — J309 Allergic rhinitis, unspecified: Secondary | ICD-10-CM

## 2020-02-12 ENCOUNTER — Ambulatory Visit (INDEPENDENT_AMBULATORY_CARE_PROVIDER_SITE_OTHER): Payer: BC Managed Care – PPO

## 2020-02-12 DIAGNOSIS — J309 Allergic rhinitis, unspecified: Secondary | ICD-10-CM | POA: Diagnosis not present

## 2020-02-14 ENCOUNTER — Ambulatory Visit (INDEPENDENT_AMBULATORY_CARE_PROVIDER_SITE_OTHER): Payer: BC Managed Care – PPO

## 2020-02-14 DIAGNOSIS — J309 Allergic rhinitis, unspecified: Secondary | ICD-10-CM

## 2020-02-21 ENCOUNTER — Ambulatory Visit (INDEPENDENT_AMBULATORY_CARE_PROVIDER_SITE_OTHER): Payer: BC Managed Care – PPO

## 2020-02-21 DIAGNOSIS — J309 Allergic rhinitis, unspecified: Secondary | ICD-10-CM

## 2020-02-25 DIAGNOSIS — Z23 Encounter for immunization: Secondary | ICD-10-CM | POA: Diagnosis not present

## 2020-02-26 ENCOUNTER — Ambulatory Visit (INDEPENDENT_AMBULATORY_CARE_PROVIDER_SITE_OTHER): Payer: BC Managed Care – PPO

## 2020-02-26 DIAGNOSIS — J309 Allergic rhinitis, unspecified: Secondary | ICD-10-CM

## 2020-02-28 ENCOUNTER — Encounter: Payer: Self-pay | Admitting: Certified Nurse Midwife

## 2020-03-03 ENCOUNTER — Ambulatory Visit (INDEPENDENT_AMBULATORY_CARE_PROVIDER_SITE_OTHER): Payer: Self-pay | Admitting: Internal Medicine

## 2020-03-04 ENCOUNTER — Encounter: Payer: Self-pay | Admitting: Allergy & Immunology

## 2020-03-04 ENCOUNTER — Other Ambulatory Visit: Payer: Self-pay

## 2020-03-04 ENCOUNTER — Ambulatory Visit: Payer: Self-pay

## 2020-03-04 ENCOUNTER — Ambulatory Visit: Payer: BC Managed Care – PPO | Admitting: Allergy & Immunology

## 2020-03-04 VITALS — BP 122/76 | HR 79 | Temp 97.8°F | Resp 18

## 2020-03-04 DIAGNOSIS — J302 Other seasonal allergic rhinitis: Secondary | ICD-10-CM | POA: Diagnosis not present

## 2020-03-04 DIAGNOSIS — J4520 Mild intermittent asthma, uncomplicated: Secondary | ICD-10-CM | POA: Diagnosis not present

## 2020-03-04 DIAGNOSIS — J3089 Other allergic rhinitis: Secondary | ICD-10-CM | POA: Diagnosis not present

## 2020-03-04 NOTE — Progress Notes (Signed)
FOLLOW UP  Date of Service/Encounter:  03/04/20   Assessment:   Mild intermittent asthma, uncomplicated  Perennial and seasonal allergic rhinitis (grasses, ragweed, weeds, trees, indoor molds, outdoor molds, cat, dog and cockroach) - on allergen immunotherapy  ? Rhinitis medicamentosa - with phenylephrine in her cold and sinus medication that she uses fairly frequently  Food intolerance  Plan/Recommendations:   1. Mild intermittent asthma, uncomplicated - Continue with albuterol as needed.  - There is no need for a controller medication.  2. Chronic rhinitis (grasses, ragweed, weeds, trees, indoor molds, outdoor molds, cat, dog and cockroach) - Ok to stop the nose sprays.  - Stop taking: Xhance (fluticasone) 1-2 sprays per nostril twice daily and Astelin (azelastine) 2 sprays per nostril 1-2 times daily as needed - Continue taking: Xyzal (levocetirizine) 5mg  tablet once daily , Singulair (montelukast) 10mg  daily - Start taking: RyVent one tablet every 8 hours (this can cause some sleepiness so be aware of that) and nasal ipratropium one spray per nostril daily (this can cause over drying) and Mucinex 600 mg twice daily (samples provided).  - Start prednisone packet provided.  3. Return in about 3 months (around 06/04/2020). This can be an in-person, a virtual Webex or a telephone follow up visit.   Subjective:   Nichole Zamora is a 44 y.o. female presenting today for follow up of  Chief Complaint  Patient presents with  . Follow-up  . Allergies    Nichole Zamora has a history of the following: Patient Active Problem List   Diagnosis Date Noted  . Essential hypertension, benign 11/12/2019  . PMS (premenstrual syndrome) 11/12/2019  . Vitamin D deficiency disease 11/12/2019  . HLD (hyperlipidemia) 11/12/2019  . Obesity (BMI 30.0-34.9) 11/12/2019  . Generalized headaches 02/09/2016  . Palpitations 02/09/2016  . Urinary, incontinence, stress female 02/09/2016      History obtained from: chart review and patient.  Nichole Zamora is a 44 y.o. female presenting for a follow up visit.  She was last seen in December 2020.  At that time, her asthma was controlled with albuterol as needed.  She had testing that was positive to pollens, indoor and outdoor molds, cat, dog, and cockroach.  We stopped her Allegra and Flonase and started Xyzal as well as Singulair and XHANCE.  We also started Astelin.  She did decide to start allergen immunotherapy.  Her first injection was in February 2021.  Since last visit, she has not done well.      Asthma/Respiratory Symptom History: She remains on the albuterol as needed. Quenisha's asthma has been well controlled. She has not required rescue medication, experienced nocturnal awakenings due to lower respiratory symptoms, nor have activities of daily living been limited. She has required no Emergency Department or Urgent Care visits for her asthma. She has required zero courses of systemic steroids for asthma exacerbations since the last visit. ACT score today is 25, indicating excellent asthma symptom control.   Allergic Rhinitis Symptom History: She is on the Xyzal and the Singulair. She has not tried using more than one in a day. Mucinex does work somewhat well. She is just unhappy that she does not feel better.   Sicily is on allergen immunotherapy. She receives two injections. Immunotherapy script #1 contains ragweed, molds and cockroach. She currently receives 0.63mL of the GOLD vial (1/10,000). Immunotherapy script #2 contains trees, weeds, grasses, cat and dog. She currently receives 0.68mL of the GOLD vial (1/10,000). She started shots February of 2021 and not yet  reached maintenance.   Otherwise, there have been no changes to her past medical history, surgical history, family history, or social history.    Review of Systems  Constitutional: Negative.  Negative for chills, fever, malaise/fatigue and weight loss.   HENT: Positive for congestion. Negative for ear discharge and ear pain.        Positive for postnasal drip.   Eyes: Negative for pain, discharge and redness.  Respiratory: Negative for cough, sputum production, shortness of breath and wheezing.   Cardiovascular: Negative.  Negative for chest pain and palpitations.  Gastrointestinal: Negative for abdominal pain, constipation, diarrhea, heartburn, nausea and vomiting.  Skin: Negative.  Negative for itching and rash.  Neurological: Negative for dizziness and headaches.  Endo/Heme/Allergies: Negative for environmental allergies. Does not bruise/bleed easily.       Objective:   Blood pressure 122/76, pulse 79, temperature 97.8 F (36.6 C), temperature source Temporal, resp. rate 18, SpO2 98 %. There is no height or weight on file to calculate BMI.   Physical Exam:  Physical Exam  Constitutional: She appears well-developed.  Somewhat euthymic today.  HENT:  Head: Normocephalic and atraumatic.  Right Ear: Tympanic membrane, external ear and ear canal normal.  Left Ear: Tympanic membrane, external ear and ear canal normal.  Nose: Mucosal edema and rhinorrhea present. No nasal deformity or septal deviation. No epistaxis. Right sinus exhibits no maxillary sinus tenderness and no frontal sinus tenderness. Left sinus exhibits no maxillary sinus tenderness and no frontal sinus tenderness.  Mouth/Throat: Uvula is midline and oropharynx is clear and moist. Mucous membranes are not pale and not dry.  No polyps.  Turbinates are erythematous.  No epistaxis.  Eyes: Pupils are equal, round, and reactive to light. Conjunctivae and EOM are normal. Right eye exhibits no chemosis and no discharge. Left eye exhibits no chemosis and no discharge. Right conjunctiva is not injected. Left conjunctiva is not injected.  Cardiovascular: Normal rate, regular rhythm and normal heart sounds.  Respiratory: Effort normal and breath sounds normal. No accessory muscle  usage. No tachypnea. No respiratory distress. She has no wheezes. She has no rhonchi. She has no rales. She exhibits no tenderness.  Moving air well in all lung fields.  No increased work of breathing.  Lymphadenopathy:    She has no cervical adenopathy.  Neurological: She is alert.  Skin: No abrasion, no petechiae and no rash noted. Rash is not papular, not vesicular and not urticarial. No erythema. No pallor.  No eczematous or urticarial lesions noted.  Psychiatric: She has a normal mood and affect.     Diagnostic studies: none    Malachi Bonds, MD  [facility]

## 2020-03-04 NOTE — Patient Instructions (Addendum)
1. Mild intermittent asthma, uncomplicated - Continue with albuterol as needed.  - There is no need for a controller medication.  2. Chronic rhinitis (grasses, ragweed, weeds, trees, indoor molds, outdoor molds, cat, dog and cockroach) - Ok to stop the nose sprays.  - Stop taking: Xhance (fluticasone) 1-2 sprays per nostril twice daily and Astelin (azelastine) 2 sprays per nostril 1-2 times daily as needed - Continue taking: Xyzal (levocetirizine) 5mg  tablet once daily , Singulair (montelukast) 10mg  daily - Start taking: RyVent one tablet every 8 hours (this can cause some sleepiness so be aware of that) and nasal ipratropium one spray per nostril daily (this can cause over drying) and Mucinex 600 mg twice daily (samples provided).  - Start prednisone packet provided.  3. Return in about 3 months (around 06/04/2020). This can be an in-person, a virtual Webex or a telephone follow up visit.   Please inform of any Emergency Department visits, hospitalizations, or changes in symptoms. Call 06/06/2020 before going to the ED for breathing or allergy symptoms since we might be able to fit you in for a sick visit. Feel free to contact us anytime with any questions, problems, or concerns.  It was a pleasure to see you again today!  Websites that have reliable patient information: 1. American Academy of Asthma, Allergy, and Immunology: www.aaaai.org 2. Food Allergy Research and Education (FARE): foodallergy.org 3. Mothers of Asthmatics: http://www.asthmacommunitynetwork.org 4. American College of Allergy, Asthma, and Immunology: www.acaai.org   COVID-19 Vaccine Information can be found at: [place] For questions related to vaccine distribution or appointments, please email [email or web address] or call [phone].     "Like" [email or web address] on Facebook and Instagram for our latest updates!       HAPPY SPRING!  Make sure you are  registered to vote! If you have moved or changed any of your contact information, you will need to get this updated before voting!  In some cases, you MAY be able to register to vote online: [phone]

## 2020-03-06 ENCOUNTER — Encounter: Payer: Self-pay | Admitting: Allergy & Immunology

## 2020-03-06 ENCOUNTER — Ambulatory Visit (INDEPENDENT_AMBULATORY_CARE_PROVIDER_SITE_OTHER): Payer: BC Managed Care – PPO

## 2020-03-06 DIAGNOSIS — J309 Allergic rhinitis, unspecified: Secondary | ICD-10-CM

## 2020-03-07 ENCOUNTER — Encounter: Payer: Self-pay | Admitting: Allergy & Immunology

## 2020-03-09 ENCOUNTER — Encounter (INDEPENDENT_AMBULATORY_CARE_PROVIDER_SITE_OTHER): Payer: Self-pay | Admitting: Nurse Practitioner

## 2020-03-09 ENCOUNTER — Other Ambulatory Visit: Payer: Self-pay

## 2020-03-09 ENCOUNTER — Ambulatory Visit (INDEPENDENT_AMBULATORY_CARE_PROVIDER_SITE_OTHER): Payer: BC Managed Care – PPO | Admitting: Nurse Practitioner

## 2020-03-09 VITALS — BP 125/80 | HR 75 | Temp 98.1°F | Ht 64.0 in | Wt 195.2 lb

## 2020-03-09 DIAGNOSIS — I10 Essential (primary) hypertension: Secondary | ICD-10-CM | POA: Diagnosis not present

## 2020-03-09 DIAGNOSIS — R05 Cough: Secondary | ICD-10-CM

## 2020-03-09 DIAGNOSIS — E559 Vitamin D deficiency, unspecified: Secondary | ICD-10-CM

## 2020-03-09 DIAGNOSIS — E782 Mixed hyperlipidemia: Secondary | ICD-10-CM | POA: Diagnosis not present

## 2020-03-09 DIAGNOSIS — R5383 Other fatigue: Secondary | ICD-10-CM

## 2020-03-09 DIAGNOSIS — R059 Cough, unspecified: Secondary | ICD-10-CM

## 2020-03-09 MED ORDER — THYROID 60 MG PO TABS: 60.0000 mg | ORAL_TABLET | Freq: Every day | ORAL | 0 refills | Status: DC

## 2020-03-09 NOTE — Progress Notes (Signed)
Subjective:  Patient ID: Nichole Zamora, female    DOB: 08-20-1976  Age: 44 y.o. MRN: [id number]  CC:  Chief Complaint  Patient presents with  . Hypertension  . Follow-up    Vitamin D deficiency, fatigue  . Hyperlipidemia      HPI  This patient comes in today for the above.  Hypertension: She has a history of hypertension.  She continues on hydrochlorothiazide 12.5 mg daily.  Vitamin D deficiency: She is a history of vitamin D deficiency.  She was taking 10,000 IUs of vitamin D3 daily.  She did get blood work collected at [facility] in January of this year.  She went to see them for help with hormone management.  Vitamin D serum level at that time was around 111.  Since then she has reduced her vitamin D3 intake to 5000 IUs daily.  Fatigue: She is on desiccated thyroid for the off label treatment of her fatigue.  She was taking 120 mcg daily, however was experiencing some cardiac palpitations and afternoon fatigue.  She has reduced her intake to 60 mcg daily, and seems to be tolerating this dose much better.  Tells me her energy is better and she is no longer experiencing cardiac palpitations.  Hyperlipidemia: She does have a history of hyperlipidemia and is currently on an omega-3 supplement.  Last lipid panel was collected in February 2020 which showed total cholesterol 169, HDL of 60, triglycerides 68, and LDL 94.  Cough: She also has concern that she may have silent reflux.  She tells me she does have a nagging cough in addition to feeling like there is something in her throat such as phlegm that she cannot get rid of.  She has started taking omeprazole over-the-counter approximately 2 days ago, and tells me her symptoms are improving since starting this medication.  She denies any feelings of heartburn.   Past Medical History:  Diagnosis Date  . Essential hypertension, benign 11/12/2019  . HLD (hyperlipidemia) 11/12/2019  . Obesity (BMI 30.0-34.9) 11/12/2019  . PMS  (premenstrual syndrome) 11/12/2019  . Thyroid disease   . Vitamin D deficiency disease 11/12/2019      Family History  Problem Relation Age of Onset  . Healthy Mother   . Healthy Father   . Allergic rhinitis Neg Hx   . Angioedema Neg Hx   . Asthma Neg Hx   . Atopy Neg Hx   . Eczema Neg Hx   . Immunodeficiency Neg Hx   . Urticaria Neg Hx     Social History   Social History Narrative   Divorced 19 years.Lives with her son.[employer] group.   Social History   Tobacco Use  . Smoking status: Never Smoker  . Smokeless tobacco: Never Used  Substance Use Topics  . Alcohol use: Not Currently     Current Meds  Medication Sig  . Bacillus Coagulans-Inulin (PROBIOTIC) 1-250 BILLION-MG CAPS Take 1 capsule by mouth daily.  . Cholecalciferol (VITAMIN D-3) 125 MCG (5000 UT) TABS Take 1 tablet by mouth daily.   . Cyanocobalamin (B-12) 2500 MCG TABS Take 1 tablet by mouth daily.  . fexofenadine (ALLEGRA) 180 MG tablet Take 180 mg by mouth daily.   . hydrochlorothiazide (HYDRODIURIL) 12.5 MG tablet Take 12.5 mg by mouth daily.   Marland Kitchen levocetirizine (XYZAL) 5 MG tablet Take 1 tablet (5 mg total) by mouth every evening.  . montelukast (SINGULAIR) 10 MG tablet Take 1 tablet (10 mg total) by mouth at  bedtime.  . Omega-3 Fatty Acids (OMEGA-3 FISH OIL PO) Take 3,200 mg by mouth daily.  Marland Kitchen omeprazole (PRILOSEC) 20 MG capsule Take 20 mg by mouth daily.  . progesterone (PROMETRIUM) 200 MG capsule TAKE 1 CAPSULE BY MOUTH ONCE DAILY AT NIGHT  . thyroid (NP THYROID) 60 MG tablet Take 1 tablet (60 mg total) by mouth daily before breakfast.  . tranexamic acid (LYSTEDA) 650 MG TABS tablet tranexamic acid 650 mg tablet  TAKE 2 TABLETS BY MOUTH THREE TIMES DAILY  . vitamin E 400 UNIT capsule Take 400 Units by mouth daily.  . [DISCONTINUED] thyroid (NP THYROID) 60 MG tablet NP Thyroid 60 mg tablet  TAKE 1 TABLET BY MOUTH ONCE DAILY ON AN EMPTY STOMACH FOR 30 DAYS    ROS:  Review of Systems    Constitutional: Negative for malaise/fatigue.  Respiratory: Positive for cough. Negative for shortness of breath and wheezing.   Cardiovascular: Negative for chest pain and palpitations.  Gastrointestinal: Negative for heartburn.  Neurological: Negative for dizziness and headaches.     Objective:   Today's Vitals: BP 125/80 (BP Location: Left Arm, Patient Position: Sitting, Cuff Size: Normal)   Pulse 75   Temp 98.1 F (36.7 C) (Oral)   Ht 5' 4" (1.626 m)   Wt 195 lb 3.2 oz (88.5 kg)   SpO2 99%   BMI 33.51 kg/m  Vitals with BMI 03/09/2020 03/04/2020 12/04/2019  Height 5' 4" - 5' 4.4"  Weight 195 lbs 3 oz - 202 lbs 6 oz  BMI 45.62 - 56.3  Systolic 893 734 287  Diastolic 80 76 84  Pulse 75 79 91     Physical Exam Vitals reviewed.  Constitutional:      General: She is not in acute distress.    Appearance: Normal appearance.  HENT:     Head: Normocephalic and atraumatic.  Neck:     Vascular: No carotid bruit.  Cardiovascular:     Rate and Rhythm: Normal rate and regular rhythm.     Pulses: Normal pulses.     Heart sounds: Normal heart sounds.  Pulmonary:     Effort: Pulmonary effort is normal.     Breath sounds: Normal breath sounds.  Skin:    General: Skin is warm and dry.  Neurological:     General: No focal deficit present.     Mental Status: She is alert and oriented to person, place, and time.  Psychiatric:        Mood and Affect: Mood normal.        Behavior: Behavior normal.        Judgment: Judgment normal.          Assessment and Plan   1. Fatigue, unspecified type   2. Essential hypertension, benign   3. Vitamin D deficiency disease   4. Mixed hyperlipidemia   5. Cough      Plan: 1.  She will continue on her 68 mcg of desiccated thyroid daily for the off label treatment of her fatigue.  I will collect thyroid panel for further evaluation today.  2.  Blood pressure well controlled on current regimen.  She will continue on her  hydrochlorothiazide as prescribed.  I will also collect a metabolic panel for further evaluation.  3.  I will collect serum vitamin D level for further evaluation today.  4.  Last lipid panel was collected over a year ago, we will recollect this today for further evaluation.  5.  I encouraged her to continue  taking her omeprazole for the next 6 to 8 weeks, and then to slowly titrate off of this.  If her symptoms return we may need to consider referral to gastroenterology for further evaluation and diagnosis of silent GERD.  She tells me she understands.     Tests ordered Orders Placed This Encounter  Procedures  . TSH  . T3, Free  . T4, Free  . CMP with eGFR([facility])  . Vitamin D, 25-hydroxy  . Hemoglobin A1c  . Lipid Panel      Meds ordered this encounter  Medications  . thyroid (NP THYROID) 60 MG tablet    Sig: Take 1 tablet (60 mg total) by mouth daily before breakfast.    Dispense:  90 tablet    Refill:  0    Order Specific Question:   Supervising Provider    Answer:   Doree Albee [[id number]]    Patient to follow-up in 2 to 3 months or sooner as needed  Ailene Ards, NP

## 2020-03-10 ENCOUNTER — Other Ambulatory Visit (INDEPENDENT_AMBULATORY_CARE_PROVIDER_SITE_OTHER): Payer: BC Managed Care – PPO

## 2020-03-10 DIAGNOSIS — R5383 Other fatigue: Secondary | ICD-10-CM | POA: Diagnosis not present

## 2020-03-10 DIAGNOSIS — I10 Essential (primary) hypertension: Secondary | ICD-10-CM | POA: Diagnosis not present

## 2020-03-10 DIAGNOSIS — E559 Vitamin D deficiency, unspecified: Secondary | ICD-10-CM | POA: Diagnosis not present

## 2020-03-10 DIAGNOSIS — E782 Mixed hyperlipidemia: Secondary | ICD-10-CM | POA: Diagnosis not present

## 2020-03-11 ENCOUNTER — Ambulatory Visit (INDEPENDENT_AMBULATORY_CARE_PROVIDER_SITE_OTHER): Payer: BC Managed Care – PPO

## 2020-03-11 ENCOUNTER — Ambulatory Visit (INDEPENDENT_AMBULATORY_CARE_PROVIDER_SITE_OTHER): Payer: BC Managed Care – PPO | Admitting: Nurse Practitioner

## 2020-03-11 DIAGNOSIS — J309 Allergic rhinitis, unspecified: Secondary | ICD-10-CM

## 2020-03-11 LAB — COMPLETE METABOLIC PANEL WITH GFR
AG Ratio: 1.5 (calc) (ref 1.0–2.5)
ALT: 10 U/L (ref 6–29)
AST: 11 U/L (ref 10–30)
Albumin: 4.2 g/dL (ref 3.6–5.1)
Alkaline phosphatase (APISO): 58 U/L (ref 31–125)
BUN: 13 mg/dL (ref 7–25)
CO2: 26 mmol/L (ref 20–32)
Calcium: 9.7 mg/dL (ref 8.6–10.2)
Chloride: 101 mmol/L (ref 98–110)
Creat: 0.75 mg/dL (ref 0.50–1.10)
GFR, Est African American: 114 mL/min/{1.73_m2} (ref >=60)
GFR, Est Non African American: 98 mL/min/{1.73_m2} (ref >=60)
Globulin: 2.8 g/dL (calc) (ref 1.9–3.7)
Glucose, Bld: 98 mg/dL (ref 65–99)
Potassium: 3.7 mmol/L (ref 3.5–5.3)
Sodium: 138 mmol/L (ref 135–146)
Total Bilirubin: 0.8 mg/dL (ref 0.2–1.2)
Total Protein: 7 g/dL (ref 6.1–8.1)

## 2020-03-11 LAB — LIPID PANEL
Cholesterol: 184 mg/dL (ref <200)
HDL: 55 mg/dL (ref >=50)
LDL Cholesterol (Calc): 114 mg/dL (calc) — ABNORMAL HIGH
Non-HDL Cholesterol (Calc): 129 mg/dL (calc) (ref <130)
Total CHOL/HDL Ratio: 3.3 (calc) (ref <5.0)
Triglycerides: 65 mg/dL (ref <150)

## 2020-03-11 LAB — VITAMIN D 25 HYDROXY (VIT D DEFICIENCY, FRACTURES): Vit D, 25-Hydroxy: 71 ng/mL (ref 30–100)

## 2020-03-11 LAB — HEMOGLOBIN A1C
Hgb A1c MFr Bld: 5.4 % of total Hgb (ref <5.7)
Mean Plasma Glucose: 108 (calc)
eAG (mmol/L): 6 (calc)

## 2020-03-11 LAB — TSH: TSH: 0.03 mIU/L — ABNORMAL LOW

## 2020-03-11 LAB — T3, FREE: T3, Free: 5 pg/mL — ABNORMAL HIGH (ref 2.3–4.2)

## 2020-03-11 LAB — T4, FREE: Free T4: 1.2 ng/dL (ref 0.8–1.8)

## 2020-03-16 DIAGNOSIS — Z23 Encounter for immunization: Secondary | ICD-10-CM | POA: Diagnosis not present

## 2020-03-25 ENCOUNTER — Ambulatory Visit (INDEPENDENT_AMBULATORY_CARE_PROVIDER_SITE_OTHER): Payer: BC Managed Care – PPO

## 2020-03-25 DIAGNOSIS — J309 Allergic rhinitis, unspecified: Secondary | ICD-10-CM | POA: Diagnosis not present

## 2020-03-27 ENCOUNTER — Ambulatory Visit (INDEPENDENT_AMBULATORY_CARE_PROVIDER_SITE_OTHER): Payer: BC Managed Care – PPO

## 2020-03-27 DIAGNOSIS — J309 Allergic rhinitis, unspecified: Secondary | ICD-10-CM

## 2020-04-01 ENCOUNTER — Ambulatory Visit (INDEPENDENT_AMBULATORY_CARE_PROVIDER_SITE_OTHER): Payer: BC Managed Care – PPO

## 2020-04-01 DIAGNOSIS — J309 Allergic rhinitis, unspecified: Secondary | ICD-10-CM

## 2020-04-03 ENCOUNTER — Ambulatory Visit (INDEPENDENT_AMBULATORY_CARE_PROVIDER_SITE_OTHER): Payer: BC Managed Care – PPO

## 2020-04-03 DIAGNOSIS — J309 Allergic rhinitis, unspecified: Secondary | ICD-10-CM

## 2020-04-08 ENCOUNTER — Ambulatory Visit (INDEPENDENT_AMBULATORY_CARE_PROVIDER_SITE_OTHER): Payer: BC Managed Care – PPO

## 2020-04-08 DIAGNOSIS — J309 Allergic rhinitis, unspecified: Secondary | ICD-10-CM

## 2020-04-08 DIAGNOSIS — L820 Inflamed seborrheic keratosis: Secondary | ICD-10-CM | POA: Diagnosis not present

## 2020-04-08 DIAGNOSIS — L818 Other specified disorders of pigmentation: Secondary | ICD-10-CM | POA: Diagnosis not present

## 2020-04-15 ENCOUNTER — Ambulatory Visit (INDEPENDENT_AMBULATORY_CARE_PROVIDER_SITE_OTHER): Payer: BC Managed Care – PPO

## 2020-04-15 DIAGNOSIS — J309 Allergic rhinitis, unspecified: Secondary | ICD-10-CM

## 2020-04-17 ENCOUNTER — Ambulatory Visit (INDEPENDENT_AMBULATORY_CARE_PROVIDER_SITE_OTHER): Payer: BC Managed Care – PPO

## 2020-04-17 DIAGNOSIS — J309 Allergic rhinitis, unspecified: Secondary | ICD-10-CM

## 2020-04-22 ENCOUNTER — Ambulatory Visit (INDEPENDENT_AMBULATORY_CARE_PROVIDER_SITE_OTHER): Payer: BC Managed Care – PPO

## 2020-04-22 DIAGNOSIS — J309 Allergic rhinitis, unspecified: Secondary | ICD-10-CM | POA: Diagnosis not present

## 2020-04-27 ENCOUNTER — Encounter: Payer: Self-pay | Admitting: Allergy & Immunology

## 2020-04-29 ENCOUNTER — Ambulatory Visit (INDEPENDENT_AMBULATORY_CARE_PROVIDER_SITE_OTHER): Payer: BC Managed Care – PPO

## 2020-04-29 DIAGNOSIS — J309 Allergic rhinitis, unspecified: Secondary | ICD-10-CM

## 2020-05-01 ENCOUNTER — Ambulatory Visit (INDEPENDENT_AMBULATORY_CARE_PROVIDER_SITE_OTHER): Payer: BC Managed Care – PPO

## 2020-05-01 DIAGNOSIS — J309 Allergic rhinitis, unspecified: Secondary | ICD-10-CM | POA: Diagnosis not present

## 2020-05-06 ENCOUNTER — Ambulatory Visit (INDEPENDENT_AMBULATORY_CARE_PROVIDER_SITE_OTHER): Payer: BC Managed Care – PPO

## 2020-05-06 DIAGNOSIS — J309 Allergic rhinitis, unspecified: Secondary | ICD-10-CM | POA: Diagnosis not present

## 2020-05-13 ENCOUNTER — Ambulatory Visit (INDEPENDENT_AMBULATORY_CARE_PROVIDER_SITE_OTHER): Payer: BC Managed Care – PPO

## 2020-05-13 DIAGNOSIS — J309 Allergic rhinitis, unspecified: Secondary | ICD-10-CM | POA: Diagnosis not present

## 2020-05-18 ENCOUNTER — Encounter (INDEPENDENT_AMBULATORY_CARE_PROVIDER_SITE_OTHER): Payer: Self-pay | Admitting: Nurse Practitioner

## 2020-05-18 ENCOUNTER — Other Ambulatory Visit: Payer: Self-pay

## 2020-05-18 ENCOUNTER — Ambulatory Visit (INDEPENDENT_AMBULATORY_CARE_PROVIDER_SITE_OTHER): Payer: BC Managed Care – PPO | Admitting: Nurse Practitioner

## 2020-05-18 VITALS — BP 115/85 | HR 75 | Temp 97.7°F | Ht 64.0 in | Wt 189.2 lb

## 2020-05-18 DIAGNOSIS — R5383 Other fatigue: Secondary | ICD-10-CM

## 2020-05-18 DIAGNOSIS — R768 Other specified abnormal immunological findings in serum: Secondary | ICD-10-CM | POA: Diagnosis not present

## 2020-05-18 DIAGNOSIS — K449 Diaphragmatic hernia without obstruction or gangrene: Secondary | ICD-10-CM | POA: Diagnosis not present

## 2020-05-18 NOTE — Progress Notes (Signed)
Subjective:  Patient ID: Nichole Zamora, female    DOB: [birth year]  Age: 44 y.o. MRN: [id number]  CC:  Chief Complaint  Patient presents with  . Fatigue  . tested postivie for Autoimmune Disorder  . Other    hiatal hernia      HPI  This patient arrives today for the above.  Fatigue/autoimmune disorder screening/Hiatal hernia: she was being treated for her fatigue off label he with NP thyroid, however after undergoing multiple dosage adjustments her fatigue persisted and she sought second opinion with a holistic practitioner.  With this practitioner she was diagnosed with having a nonspecific autoimmune disorder and was found to have a hiatal hernia.  Since starting with this practitioner she has stopped her NP thyroid completely, and tells me she is feeling well.  She is also on a low inflammatory diet that focuses on whole foods.  She did have blood work completed with the practitioner and found that her ANA titer was positive, but was not given a specific autoimmune disorder diagnosis.  She is also been evaluated and treated by massage therapist for her hiatal hernia, and tells me since working with the therapist she has not been experiencing any more abdominal pain or heartburn.    Past Medical History:  Diagnosis Date  . Essential hypertension, benign 11/12/2019  . HLD (hyperlipidemia) 11/12/2019  . Obesity (BMI 30.0-34.9) 11/12/2019  . PMS (premenstrual syndrome) 11/12/2019  . Thyroid disease   . Vitamin D deficiency disease 11/12/2019      Family History  Problem Relation Age of Onset  . Healthy Mother   . Healthy Father   . Allergic rhinitis Neg Hx   . Angioedema Neg Hx   . Asthma Neg Hx   . Atopy Neg Hx   . Eczema Neg Hx   . Immunodeficiency Neg Hx   . Urticaria Neg Hx     Social History   Social History Narrative   Divorced 19 years.Lives with her son.[employer] group.   Social History   Tobacco Use  . Smoking status: Never Smoker  .  Smokeless tobacco: Never Used  Substance Use Topics  . Alcohol use: Not Currently     Current Meds  Medication Sig  . Bacillus Coagulans-Inulin (PROBIOTIC) 1-250 BILLION-MG CAPS Take 1 capsule by mouth daily.  . Cholecalciferol (VITAMIN D-3) 125 MCG (5000 UT) TABS Take 1 tablet by mouth daily.   . hydrochlorothiazide (HYDRODIURIL) 12.5 MG tablet Take 12.5 mg by mouth daily.   Bing Ree A PO Take 1 tablet by mouth 3 (three) times a week.  . levocetirizine (XYZAL) 5 MG tablet Take 1 tablet (5 mg total) by mouth every evening.  . montelukast (SINGULAIR) 10 MG tablet Take 1 tablet (10 mg total) by mouth at bedtime.  Marland Kitchen OVER THE COUNTER MEDICATION Adapticin 1 tablet daily  . OVER THE COUNTER MEDICATION Thyroxin 2 tablets daily  . vitamin E 400 UNIT capsule Take 400 Units by mouth daily.    ROS:  Review of Systems  Eyes: Negative for blurred vision and double vision.  Respiratory: Negative for shortness of breath.   Cardiovascular: Negative for chest pain and palpitations.  Gastrointestinal: Negative for abdominal pain, blood in stool and heartburn.     Objective:   Today's Vitals: BP 115/85 (BP Location: Left Arm, Patient Position: Sitting, Cuff Size: Normal)   Pulse 75   Temp 97.7 F (36.5 C) (Temporal)   Ht 5\' 4"  (1.626 m)   Wt  189 lb 3.2 oz (85.8 kg)   SpO2 94%   BMI 32.48 kg/m  Vitals with BMI 05/18/2020 03/09/2020 03/04/2020  Height 5\' 4"  5\' 4"  -  Weight 189 lbs 3 oz 195 lbs 3 oz -  BMI 26.33 35.45 -  Systolic 625 638 937  Diastolic 85 80 76  Pulse 75 75 79     Physical Exam Vitals reviewed.  Constitutional:      General: She is not in acute distress.    Appearance: Normal appearance.  HENT:     Head: Normocephalic and atraumatic.  Neck:     Vascular: No carotid bruit.  Cardiovascular:     Rate and Rhythm: Normal rate and regular rhythm.     Pulses: Normal pulses.     Heart sounds: Normal heart sounds.  Pulmonary:     Effort: Pulmonary effort is normal.      Breath sounds: Normal breath sounds.  Skin:    General: Skin is warm and dry.  Neurological:     General: No focal deficit present.     Mental Status: She is alert and oriented to person, place, and time.  Psychiatric:        Mood and Affect: Mood normal.        Behavior: Behavior normal.        Judgment: Judgment normal.          Assessment and Plan   1. Fatigue, unspecified type   2. Hiatal hernia   3. Positive ANA (antinuclear antibody)      Plan: 1.  She is feeling quite well on supplements recommended by her holistic provider in addition to following her new diet.  I encouraged her to stay off the NP thyroid for now and we will check a TSH level just to make sure that this is in the normal range.  2.  This seems to be resolved at this time.  3.  For now she is can continue to follow-up with her holistic provider, but I did encourage her to let me know if she starts to feel unwell in any way especially if she experiences fatigue, unexplained rash, changes in her urine, or bowels at which time we will have to refer her to rheumatology for further evaluation.  She tells me she understands.   Tests ordered Orders Placed This Encounter  Procedures  . TSH      No orders of the defined types were placed in this encounter.   Patient to follow-up in 1 month for wellness exam required by her work.  Ailene Ards, NP

## 2020-05-19 LAB — TSH: TSH: 3.55 mIU/L

## 2020-05-27 ENCOUNTER — Ambulatory Visit (INDEPENDENT_AMBULATORY_CARE_PROVIDER_SITE_OTHER): Payer: BC Managed Care – PPO

## 2020-05-27 DIAGNOSIS — J309 Allergic rhinitis, unspecified: Secondary | ICD-10-CM

## 2020-06-03 ENCOUNTER — Ambulatory Visit (INDEPENDENT_AMBULATORY_CARE_PROVIDER_SITE_OTHER): Payer: BC Managed Care – PPO

## 2020-06-03 DIAGNOSIS — J309 Allergic rhinitis, unspecified: Secondary | ICD-10-CM | POA: Diagnosis not present

## 2020-06-07 ENCOUNTER — Encounter: Payer: Self-pay | Admitting: Allergy & Immunology

## 2020-06-08 ENCOUNTER — Telehealth: Payer: Self-pay | Admitting: Allergy & Immunology

## 2020-06-08 MED ORDER — MONTELUKAST SODIUM 10 MG PO TABS: 10.0000 mg | ORAL_TABLET | Freq: Every day | ORAL | 5 refills | Status: DC

## 2020-06-08 MED ORDER — LEVOCETIRIZINE DIHYDROCHLORIDE 5 MG PO TABS: 5.0000 mg | ORAL_TABLET | Freq: Every evening | ORAL | 5 refills | Status: DC

## 2020-06-08 NOTE — Telephone Encounter (Signed)
Refills sent to requested pharmacy. I have also left a detailed message advising patient of this information and to call back if she has any other questions.

## 2020-06-08 NOTE — Telephone Encounter (Signed)
Patient called and said that her pharmacy sent 2 request and we has not receive them. She needs to have singulair, xyzal th [facility] [place]. [phone].

## 2020-06-10 ENCOUNTER — Ambulatory Visit (INDEPENDENT_AMBULATORY_CARE_PROVIDER_SITE_OTHER): Payer: BC Managed Care – PPO

## 2020-06-10 DIAGNOSIS — J309 Allergic rhinitis, unspecified: Secondary | ICD-10-CM

## 2020-06-16 ENCOUNTER — Other Ambulatory Visit (INDEPENDENT_AMBULATORY_CARE_PROVIDER_SITE_OTHER): Payer: Self-pay | Admitting: Internal Medicine

## 2020-06-17 ENCOUNTER — Ambulatory Visit (INDEPENDENT_AMBULATORY_CARE_PROVIDER_SITE_OTHER): Payer: BC Managed Care – PPO | Admitting: Nurse Practitioner

## 2020-06-17 ENCOUNTER — Ambulatory Visit (INDEPENDENT_AMBULATORY_CARE_PROVIDER_SITE_OTHER): Payer: BC Managed Care – PPO

## 2020-06-17 ENCOUNTER — Other Ambulatory Visit: Payer: Self-pay

## 2020-06-17 ENCOUNTER — Encounter (INDEPENDENT_AMBULATORY_CARE_PROVIDER_SITE_OTHER): Payer: Self-pay | Admitting: Nurse Practitioner

## 2020-06-17 VITALS — BP 120/80 | HR 76 | Temp 97.5°F | Ht 64.0 in | Wt 182.8 lb

## 2020-06-17 DIAGNOSIS — J309 Allergic rhinitis, unspecified: Secondary | ICD-10-CM

## 2020-06-17 DIAGNOSIS — Z0001 Encounter for general adult medical examination with abnormal findings: Secondary | ICD-10-CM

## 2020-06-17 NOTE — Progress Notes (Signed)
Subjective:  Patient ID: Nichole Zamora, female    DOB: [birth year]  Age: 44 y.o. MRN: [id number]  CC:  Chief Complaint  Patient presents with  . Follow-up      HPI  This patient arrives today with paperwork to be filled out for her job.  Work is asking for recent lab work results as well as waist circumference.  She has no acute complaints today.   Past Medical History:  Diagnosis Date  . Essential hypertension, benign 11/12/2019  . HLD (hyperlipidemia) 11/12/2019  . Obesity (BMI 30.0-34.9) 11/12/2019  . PMS (premenstrual syndrome) 11/12/2019  . Thyroid disease   . Vitamin D deficiency disease 11/12/2019      Family History  Problem Relation Age of Onset  . Healthy Mother   . Healthy Father   . Allergic rhinitis Neg Hx   . Angioedema Neg Hx   . Asthma Neg Hx   . Atopy Neg Hx   . Eczema Neg Hx   . Immunodeficiency Neg Hx   . Urticaria Neg Hx     Social History   Social History Narrative   Divorced 19 years.Lives with her son.[employer] group.   Social History   Tobacco Use  . Smoking status: Never Smoker  . Smokeless tobacco: Never Used  Substance Use Topics  . Alcohol use: Not Currently     Current Meds  Medication Sig  . Bacillus Coagulans-Inulin (PROBIOTIC) 1-250 BILLION-MG CAPS Take 1 capsule by mouth daily.  . Cholecalciferol (VITAMIN D-3) 125 MCG (5000 UT) TABS Take 1 tablet by mouth daily.   . hydrochlorothiazide (MICROZIDE) 12.5 MG capsule Take 1 capsule by mouth once daily  . IODINE-VITAMIN A PO Take 1 tablet by mouth 3 (three) times a week.  . levocetirizine (XYZAL) 5 MG tablet Take 1 tablet (5 mg total) by mouth every evening.  . montelukast (SINGULAIR) 10 MG tablet Take 1 tablet (10 mg total) by mouth at bedtime.  Marland Kitchen OVER THE COUNTER MEDICATION Adapticin 1 tablet daily  . OVER THE COUNTER MEDICATION Thyroxin 2 tablets daily  . [DISCONTINUED] hydrochlorothiazide (HYDRODIURIL) 12.5 MG tablet Take 12.5 mg by mouth daily.      ROS:  Review of Systems  Constitutional: Negative.   Respiratory: Negative.   Cardiovascular: Negative.   Neurological: Negative.      Objective:   Today's Vitals: BP 120/80 (BP Location: Right Arm, Patient Position: Sitting, Cuff Size: Normal)   Pulse 76   Temp (!) 97.5 F (36.4 C) (Temporal)   Ht 5\' 4"  (1.626 m)   Wt 182 lb 12.8 oz (82.9 kg)   LMP 05/26/2020   SpO2 97%   BMI 31.38 kg/m  Vitals with BMI 06/17/2020 05/18/2020 03/09/2020  Height 5\' 4"  5\' 4"  5\' 4"   Weight 182 lbs 13 oz 189 lbs 3 oz 195 lbs 3 oz  BMI 31.36 32.46 33.49  Systolic 120 115 03/11/2020  Diastolic 80 85 80  Pulse 76 75 75     Physical Exam Vitals reviewed.  Constitutional:      General: She is not in acute distress.    Appearance: Normal appearance.  HENT:     Head: Normocephalic and atraumatic.  Neck:     Vascular: No carotid bruit.  Cardiovascular:     Rate and Rhythm: Normal rate and regular rhythm.     Pulses: Normal pulses.     Heart sounds: Normal heart sounds.  Pulmonary:     Effort: Pulmonary effort is normal.  Breath sounds: Normal breath sounds.  Skin:    General: Skin is warm and dry.  Neurological:     General: No focal deficit present.     Mental Status: She is alert and oriented to person, place, and time.  Psychiatric:        Mood and Affect: Mood normal.        Behavior: Behavior normal.        Judgment: Judgment normal.          Assessment and Plan   1. Encounter for general adult medical examination with abnormal findings      Plan: 1.  Paperwork completed for the patient today.  We will keep a copy for our records, and then will fax over to number on the bottom of the form.  She has no additional acute concerns today.   Tests ordered No orders of the defined types were placed in this encounter.     No orders of the defined types were placed in this encounter.   Patient to follow-up in 3 to 6 months for her annual physical exam.  Nichole Paddy,  NP

## 2020-07-01 ENCOUNTER — Ambulatory Visit (INDEPENDENT_AMBULATORY_CARE_PROVIDER_SITE_OTHER): Payer: BC Managed Care – PPO

## 2020-07-01 DIAGNOSIS — J309 Allergic rhinitis, unspecified: Secondary | ICD-10-CM

## 2020-07-15 ENCOUNTER — Ambulatory Visit (INDEPENDENT_AMBULATORY_CARE_PROVIDER_SITE_OTHER): Payer: BC Managed Care – PPO

## 2020-07-15 DIAGNOSIS — J309 Allergic rhinitis, unspecified: Secondary | ICD-10-CM

## 2020-07-22 ENCOUNTER — Ambulatory Visit (INDEPENDENT_AMBULATORY_CARE_PROVIDER_SITE_OTHER): Payer: BC Managed Care – PPO

## 2020-07-22 DIAGNOSIS — J309 Allergic rhinitis, unspecified: Secondary | ICD-10-CM

## 2020-07-29 ENCOUNTER — Ambulatory Visit (INDEPENDENT_AMBULATORY_CARE_PROVIDER_SITE_OTHER): Payer: BC Managed Care – PPO

## 2020-07-29 DIAGNOSIS — J309 Allergic rhinitis, unspecified: Secondary | ICD-10-CM

## 2020-08-12 ENCOUNTER — Ambulatory Visit (INDEPENDENT_AMBULATORY_CARE_PROVIDER_SITE_OTHER): Payer: BC Managed Care – PPO

## 2020-08-12 DIAGNOSIS — J309 Allergic rhinitis, unspecified: Secondary | ICD-10-CM

## 2020-08-26 ENCOUNTER — Ambulatory Visit (INDEPENDENT_AMBULATORY_CARE_PROVIDER_SITE_OTHER): Payer: BC Managed Care – PPO

## 2020-08-26 DIAGNOSIS — J309 Allergic rhinitis, unspecified: Secondary | ICD-10-CM | POA: Diagnosis not present

## 2020-09-02 ENCOUNTER — Ambulatory Visit (INDEPENDENT_AMBULATORY_CARE_PROVIDER_SITE_OTHER): Payer: BC Managed Care – PPO

## 2020-09-02 DIAGNOSIS — J309 Allergic rhinitis, unspecified: Secondary | ICD-10-CM

## 2020-09-09 ENCOUNTER — Ambulatory Visit (INDEPENDENT_AMBULATORY_CARE_PROVIDER_SITE_OTHER): Payer: BC Managed Care – PPO

## 2020-09-09 DIAGNOSIS — J309 Allergic rhinitis, unspecified: Secondary | ICD-10-CM | POA: Diagnosis not present

## 2020-09-11 ENCOUNTER — Other Ambulatory Visit (INDEPENDENT_AMBULATORY_CARE_PROVIDER_SITE_OTHER): Payer: Self-pay | Admitting: Nurse Practitioner

## 2020-09-14 ENCOUNTER — Other Ambulatory Visit (INDEPENDENT_AMBULATORY_CARE_PROVIDER_SITE_OTHER): Payer: Self-pay | Admitting: Internal Medicine

## 2020-09-14 ENCOUNTER — Encounter (INDEPENDENT_AMBULATORY_CARE_PROVIDER_SITE_OTHER): Payer: Self-pay | Admitting: Nurse Practitioner

## 2020-09-14 MED ORDER — HYDROCHLOROTHIAZIDE 12.5 MG PO CAPS: 12.5000 mg | ORAL_CAPSULE | Freq: Every day | ORAL | 1 refills | Status: DC

## 2020-09-15 ENCOUNTER — Other Ambulatory Visit (INDEPENDENT_AMBULATORY_CARE_PROVIDER_SITE_OTHER): Payer: Self-pay | Admitting: Nurse Practitioner

## 2020-09-16 ENCOUNTER — Ambulatory Visit (INDEPENDENT_AMBULATORY_CARE_PROVIDER_SITE_OTHER): Payer: BC Managed Care – PPO

## 2020-09-16 DIAGNOSIS — J309 Allergic rhinitis, unspecified: Secondary | ICD-10-CM

## 2020-09-23 ENCOUNTER — Ambulatory Visit (INDEPENDENT_AMBULATORY_CARE_PROVIDER_SITE_OTHER): Payer: BC Managed Care – PPO

## 2020-09-23 DIAGNOSIS — J309 Allergic rhinitis, unspecified: Secondary | ICD-10-CM | POA: Diagnosis not present

## 2020-10-07 ENCOUNTER — Ambulatory Visit (INDEPENDENT_AMBULATORY_CARE_PROVIDER_SITE_OTHER): Payer: BC Managed Care – PPO

## 2020-10-07 DIAGNOSIS — J309 Allergic rhinitis, unspecified: Secondary | ICD-10-CM

## 2020-10-14 ENCOUNTER — Ambulatory Visit (INDEPENDENT_AMBULATORY_CARE_PROVIDER_SITE_OTHER): Payer: BC Managed Care – PPO

## 2020-10-14 DIAGNOSIS — J309 Allergic rhinitis, unspecified: Secondary | ICD-10-CM

## 2020-10-23 ENCOUNTER — Ambulatory Visit (INDEPENDENT_AMBULATORY_CARE_PROVIDER_SITE_OTHER): Payer: BC Managed Care – PPO

## 2020-10-23 DIAGNOSIS — J309 Allergic rhinitis, unspecified: Secondary | ICD-10-CM

## 2020-10-27 DIAGNOSIS — Z1231 Encounter for screening mammogram for malignant neoplasm of breast: Secondary | ICD-10-CM | POA: Diagnosis not present

## 2020-11-11 ENCOUNTER — Ambulatory Visit (INDEPENDENT_AMBULATORY_CARE_PROVIDER_SITE_OTHER): Payer: BC Managed Care – PPO

## 2020-11-11 DIAGNOSIS — J309 Allergic rhinitis, unspecified: Secondary | ICD-10-CM

## 2020-11-18 ENCOUNTER — Ambulatory Visit (INDEPENDENT_AMBULATORY_CARE_PROVIDER_SITE_OTHER): Payer: BC Managed Care – PPO

## 2020-11-18 DIAGNOSIS — J309 Allergic rhinitis, unspecified: Secondary | ICD-10-CM

## 2020-11-25 ENCOUNTER — Ambulatory Visit (INDEPENDENT_AMBULATORY_CARE_PROVIDER_SITE_OTHER): Payer: BC Managed Care – PPO

## 2020-11-25 DIAGNOSIS — J309 Allergic rhinitis, unspecified: Secondary | ICD-10-CM | POA: Diagnosis not present

## 2020-12-02 ENCOUNTER — Ambulatory Visit (INDEPENDENT_AMBULATORY_CARE_PROVIDER_SITE_OTHER): Payer: BC Managed Care – PPO

## 2020-12-02 DIAGNOSIS — J309 Allergic rhinitis, unspecified: Secondary | ICD-10-CM | POA: Diagnosis not present

## 2020-12-09 ENCOUNTER — Encounter (INDEPENDENT_AMBULATORY_CARE_PROVIDER_SITE_OTHER): Payer: Self-pay | Admitting: Internal Medicine

## 2020-12-09 ENCOUNTER — Ambulatory Visit (INDEPENDENT_AMBULATORY_CARE_PROVIDER_SITE_OTHER): Payer: BC Managed Care – PPO

## 2020-12-09 DIAGNOSIS — J309 Allergic rhinitis, unspecified: Secondary | ICD-10-CM | POA: Diagnosis not present

## 2020-12-16 DIAGNOSIS — J3081 Allergic rhinitis due to animal (cat) (dog) hair and dander: Secondary | ICD-10-CM | POA: Diagnosis not present

## 2020-12-16 NOTE — Progress Notes (Signed)
VIALS EXP 12-16-21 

## 2020-12-23 ENCOUNTER — Ambulatory Visit (INDEPENDENT_AMBULATORY_CARE_PROVIDER_SITE_OTHER): Payer: BC Managed Care – PPO

## 2020-12-23 DIAGNOSIS — J309 Allergic rhinitis, unspecified: Secondary | ICD-10-CM

## 2020-12-24 ENCOUNTER — Other Ambulatory Visit (INDEPENDENT_AMBULATORY_CARE_PROVIDER_SITE_OTHER): Payer: Self-pay | Admitting: Internal Medicine

## 2020-12-24 DIAGNOSIS — E559 Vitamin D deficiency, unspecified: Secondary | ICD-10-CM

## 2020-12-24 DIAGNOSIS — Z131 Encounter for screening for diabetes mellitus: Secondary | ICD-10-CM

## 2020-12-24 DIAGNOSIS — I10 Essential (primary) hypertension: Secondary | ICD-10-CM

## 2020-12-24 DIAGNOSIS — E782 Mixed hyperlipidemia: Secondary | ICD-10-CM

## 2020-12-24 DIAGNOSIS — R5383 Other fatigue: Secondary | ICD-10-CM

## 2020-12-27 ENCOUNTER — Other Ambulatory Visit (INDEPENDENT_AMBULATORY_CARE_PROVIDER_SITE_OTHER): Payer: Self-pay | Admitting: Internal Medicine

## 2020-12-28 ENCOUNTER — Encounter (INDEPENDENT_AMBULATORY_CARE_PROVIDER_SITE_OTHER): Payer: BC Managed Care – PPO | Admitting: Nurse Practitioner

## 2020-12-29 ENCOUNTER — Other Ambulatory Visit (INDEPENDENT_AMBULATORY_CARE_PROVIDER_SITE_OTHER): Payer: BC Managed Care – PPO

## 2020-12-30 ENCOUNTER — Other Ambulatory Visit: Payer: Self-pay

## 2020-12-30 ENCOUNTER — Other Ambulatory Visit (INDEPENDENT_AMBULATORY_CARE_PROVIDER_SITE_OTHER): Payer: BC Managed Care – PPO

## 2020-12-30 DIAGNOSIS — R5383 Other fatigue: Secondary | ICD-10-CM | POA: Diagnosis not present

## 2020-12-30 DIAGNOSIS — E782 Mixed hyperlipidemia: Secondary | ICD-10-CM | POA: Diagnosis not present

## 2020-12-30 DIAGNOSIS — E559 Vitamin D deficiency, unspecified: Secondary | ICD-10-CM | POA: Diagnosis not present

## 2020-12-30 DIAGNOSIS — Z131 Encounter for screening for diabetes mellitus: Secondary | ICD-10-CM | POA: Diagnosis not present

## 2021-01-02 LAB — COMPLETE METABOLIC PANEL WITH GFR
AG Ratio: 1.5 (calc) (ref 1.0–2.5)
ALT: 12 U/L (ref 6–29)
AST: 16 U/L (ref 10–30)
Albumin: 4.4 g/dL (ref 3.6–5.1)
Alkaline phosphatase (APISO): 56 U/L (ref 31–125)
BUN: 11 mg/dL (ref 7–25)
CO2: 26 mmol/L (ref 20–32)
Calcium: 9.8 mg/dL (ref 8.6–10.2)
Chloride: 101 mmol/L (ref 98–110)
Creat: 0.85 mg/dL (ref 0.50–1.10)
GFR, Est African American: 97 mL/min/{1.73_m2} (ref >=60)
GFR, Est Non African American: 84 mL/min/{1.73_m2} (ref >=60)
Globulin: 2.9 g/dL (calc) (ref 1.9–3.7)
Glucose, Bld: 83 mg/dL (ref 65–99)
Potassium: 3.9 mmol/L (ref 3.5–5.3)
Sodium: 138 mmol/L (ref 135–146)
Total Bilirubin: 1.2 mg/dL (ref 0.2–1.2)
Total Protein: 7.3 g/dL (ref 6.1–8.1)

## 2021-01-02 LAB — LIPID PANEL
Cholesterol: 188 mg/dL (ref <200)
HDL: 70 mg/dL (ref >=50)
LDL Cholesterol (Calc): 101 mg/dL (calc) — ABNORMAL HIGH
Non-HDL Cholesterol (Calc): 118 mg/dL (calc) (ref <130)
Total CHOL/HDL Ratio: 2.7 (calc) (ref <5.0)
Triglycerides: 79 mg/dL (ref <150)

## 2021-01-02 LAB — CBC
HCT: 45.2 % — ABNORMAL HIGH (ref 35.0–45.0)
Hemoglobin: 15.1 g/dL (ref 11.7–15.5)
MCH: 27.5 pg (ref 27.0–33.0)
MCHC: 33.4 g/dL (ref 32.0–36.0)
MCV: 82.2 fL (ref 80.0–100.0)
MPV: 11.4 fL (ref 7.5–12.5)
Platelets: 290 10*3/uL (ref 140–400)
RBC: 5.5 10*6/uL — ABNORMAL HIGH (ref 3.80–5.10)
RDW: 12.8 % (ref 11.0–15.0)
WBC: 10.6 10*3/uL (ref 3.8–10.8)

## 2021-01-02 LAB — T3, FREE: T3, Free: 2.4 pg/mL (ref 2.3–4.2)

## 2021-01-02 LAB — HEMOGLOBIN A1C
Hgb A1c MFr Bld: 5.4 % of total Hgb (ref <5.7)
Mean Plasma Glucose: 108 mg/dL
eAG (mmol/L): 6 mmol/L

## 2021-01-02 LAB — T4, FREE: Free T4: 1.2 ng/dL (ref 0.8–1.8)

## 2021-01-02 LAB — THYROID PEROXIDASE ANTIBODIES (TPO) (REFL): Thyroperoxidase Ab SerPl-aCnc: 2 IU/mL (ref <9)

## 2021-01-02 LAB — T3, REVERSE: T3, Reverse: 22 ng/dL (ref 8–25)

## 2021-01-02 LAB — VITAMIN D 25 HYDROXY (VIT D DEFICIENCY, FRACTURES): Vit D, 25-Hydroxy: 70 ng/mL (ref 30–100)

## 2021-01-02 LAB — TSH: TSH: 2.7 mIU/L

## 2021-01-12 DIAGNOSIS — J342 Deviated nasal septum: Secondary | ICD-10-CM | POA: Diagnosis not present

## 2021-01-12 DIAGNOSIS — K449 Diaphragmatic hernia without obstruction or gangrene: Secondary | ICD-10-CM | POA: Diagnosis not present

## 2021-01-20 ENCOUNTER — Ambulatory Visit (INDEPENDENT_AMBULATORY_CARE_PROVIDER_SITE_OTHER): Payer: BC Managed Care – PPO | Admitting: Nurse Practitioner

## 2021-01-20 ENCOUNTER — Encounter (INDEPENDENT_AMBULATORY_CARE_PROVIDER_SITE_OTHER): Payer: Self-pay | Admitting: Nurse Practitioner

## 2021-01-20 ENCOUNTER — Other Ambulatory Visit: Payer: Self-pay

## 2021-01-20 VITALS — BP 115/90 | HR 71 | Temp 97.3°F | Ht 63.0 in | Wt 182.1 lb

## 2021-01-20 DIAGNOSIS — R718 Other abnormality of red blood cells: Secondary | ICD-10-CM | POA: Diagnosis not present

## 2021-01-20 DIAGNOSIS — Z0001 Encounter for general adult medical examination with abnormal findings: Secondary | ICD-10-CM | POA: Diagnosis not present

## 2021-01-20 DIAGNOSIS — K219 Gastro-esophageal reflux disease without esophagitis: Secondary | ICD-10-CM | POA: Diagnosis not present

## 2021-01-20 DIAGNOSIS — R0602 Shortness of breath: Secondary | ICD-10-CM

## 2021-01-20 NOTE — Progress Notes (Signed)
Subjective:  Patient ID: Nichole Zamora, female    DOB: Jul 17, 1976  Age: 45 y.o. MRN: [id number]  CC:  Chief Complaint  Patient presents with  . Annual Exam    Doing well      HPI  This patient arrives today for the above.  Overall, she tells me she is feeling well.  She does experience a hard time taking a deep breath and has been following a holistic doctor.  The holistic doctor feels that she probably has a hiatal hernia, she is also seeing a massage therapist for this.  She tells me she sees a massage therapist she is able to adjust her stomach and it seems to pull her hernia down which improves her symptoms.  Her symptoms do end up recurring and she would like to have this evaluated by a surgeon, but the surgeon is requiring her to have imaging to diagnose the hiatal hernia.  She is also seen an ear nose and throat doctor recently who diagnosed her with a deviated septum.  While undergoing testing with the ENT doctor he had noted signs of mild reflux in her esophagus.  She denies feeling symptoms of heartburn.  Blood work was collected at last office visit which showed normal CMP, mildly elevated LDL (101), normal A1c, normal thyroid hormones, mildly elevated red blood cell count (5.50), and vitamin D level of 70.  As far as health maintenance is concerned she is up-to-date with her Pap smear and mammogram.  She has had flu, tetanus, and COVID-19 vaccines administered.    Past Medical History:  Diagnosis Date  . Essential hypertension, benign 11/12/2019  . HLD (hyperlipidemia) 11/12/2019  . Obesity (BMI 30.0-34.9) 11/12/2019  . PMS (premenstrual syndrome) 11/12/2019  . Thyroid disease   . Vitamin D deficiency disease 11/12/2019      Family History  Problem Relation Age of Onset  . Healthy Mother   . Healthy Father   . Allergic rhinitis Neg Hx   . Angioedema Neg Hx   . Asthma Neg Hx   . Atopy Neg Hx   . Eczema Neg Hx   . Immunodeficiency Neg Hx   . Urticaria Neg  Hx     Social History   Social History Narrative   Divorced 19 years.Lives with her son.[employer] group.   Social History   Tobacco Use  . Smoking status: Never Smoker  . Smokeless tobacco: Never Used  Substance Use Topics  . Alcohol use: Not Currently     Current Meds  Medication Sig  . Bacillus Coagulans-Inulin (PROBIOTIC) 1-250 BILLION-MG CAPS Take 1 capsule by mouth daily.  . Cholecalciferol (VITAMIN D-3) 125 MCG (5000 UT) TABS Take 1 tablet by mouth daily.   . famotidine (PEPCID) 40 MG tablet Take 40 mg by mouth 2 (two) times daily.  . hydrochlorothiazide (MICROZIDE) 12.5 MG capsule Take 1 capsule by mouth once daily  . levocetirizine (XYZAL) 5 MG tablet Take 1 tablet (5 mg total) by mouth every evening.  Marland Kitchen OVER THE COUNTER MEDICATION Adapticin 1 tablet daily    ROS:  See HPI   Objective:   Today's Vitals: BP 115/90   Pulse 71   Temp (!) 97.3 F (36.3 C)   Ht 5\' 3"  (1.6 m)   Wt 182 lb 2 oz (82.6 kg)   BMI 32.26 kg/m  Vitals with BMI 01/20/2021 06/17/2020 05/18/2020  Height 5\' 3"  5\' 4"  5\' 4"   Weight 182 lbs 2 oz 182 lbs 13  oz 189 lbs 3 oz  BMI 32.27 31.36 32.46  Systolic 115 120 737  Diastolic 90 80 85  Pulse 71 76 75     Physical Exam Vitals reviewed.  Constitutional:      Appearance: Normal appearance.  HENT:     Head: Normocephalic and atraumatic.     Right Ear: Tympanic membrane, ear canal and external ear normal.     Left Ear: Tympanic membrane, ear canal and external ear normal.  Eyes:     General:        Right eye: No discharge.        Left eye: No discharge.     Extraocular Movements: Extraocular movements intact.     Conjunctiva/sclera: Conjunctivae normal.     Pupils: Pupils are equal, round, and reactive to light.  Neck:     Vascular: No carotid bruit.  Cardiovascular:     Rate and Rhythm: Normal rate and regular rhythm.     Pulses: Normal pulses.     Heart sounds: Normal heart sounds. No murmur heard.   Pulmonary:      Effort: Pulmonary effort is normal.     Breath sounds: Normal breath sounds.  Chest:  Breasts: Breasts are symmetrical.     Right: Normal. No supraclavicular adenopathy.     Left: Normal. No supraclavicular adenopathy.      Comments: Jodene Nam as chaperone Abdominal:     General: Abdomen is flat. Bowel sounds are normal. There is no distension.     Palpations: Abdomen is soft. There is no mass.     Tenderness: There is no abdominal tenderness.  Musculoskeletal:        General: No tenderness.     Cervical back: Neck supple. No muscular tenderness.     Right lower leg: No edema.     Left lower leg: No edema.  Lymphadenopathy:     Cervical: No cervical adenopathy.     Upper Body:     Right upper body: No supraclavicular adenopathy.     Left upper body: No supraclavicular adenopathy.  Skin:    General: Skin is warm and dry.  Neurological:     General: No focal deficit present.     Mental Status: She is alert and oriented to person, place, and time.     Motor: No weakness.     Gait: Gait normal.  Psychiatric:        Mood and Affect: Mood normal.        Behavior: Behavior normal.        Judgment: Judgment normal.          Assessment and Plan   1. Encounter for general adult medical examination with abnormal findings   2. Shortness of breath   3. Gastroesophageal reflux disease, unspecified whether esophagitis present   4. Elevated red blood cell count      Plan: 1.  She is up-to-date with recommended screenings for a female of her age except for hepatitis C screening.  She would be willing to undergo screening Blood work is collected. 2.,  3.  Per shared decision making with the patient as well as with consultation with radiology and my consulting physician Dr. Karilyn Cota, we will move forward with barium swallow for further evaluation of possible hiatal hernia. 4.  We will check CBC at next office visit and blood work is collected for monitoring.  Tests ordered No  orders of the defined types were placed in this encounter.  No orders of the defined types were placed in this encounter.   Patient to follow-up in 3-6 months or sooner as needed.  In addition to performing her annual physical as performed an office visit to address her concerns.  [name], NP

## 2021-01-21 ENCOUNTER — Telehealth (INDEPENDENT_AMBULATORY_CARE_PROVIDER_SITE_OTHER): Payer: Self-pay | Admitting: Nurse Practitioner

## 2021-01-21 NOTE — Telephone Encounter (Signed)
Scheduled for 01/27/21 8:45am at [facility] (NPO 3 hrs before) Wasn't able to speak with her but left her a VM to call back

## 2021-01-21 NOTE — Telephone Encounter (Signed)
Pt aware.

## 2021-01-21 NOTE — Telephone Encounter (Signed)
When you have a moment, please call this patient and let her know that I discussed imaging options in detail with Dr. Karilyn Cota and he recommends that if hiatal hernia can be seen on barium swallow that we go with that test.  I have placed the order for this imaging test, will you see if you can initiate prior approval if necessary, and get it scheduled?  If not, please send me this note back once you have talked to the patient so that way I can forward it to someone to make sure it gets approved and scheduled.  Thank you so much for your help.

## 2021-01-27 ENCOUNTER — Ambulatory Visit (HOSPITAL_COMMUNITY)
Admission: RE | Admit: 2021-01-27 | Discharge: 2021-01-27 | Disposition: A | Discharge status: Home / Self Care | Payer: BC Managed Care – PPO | Source: Ambulatory Visit | Attending: Nurse Practitioner | Admitting: Nurse Practitioner

## 2021-01-27 ENCOUNTER — Other Ambulatory Visit: Payer: Self-pay

## 2021-01-27 DIAGNOSIS — K219 Gastro-esophageal reflux disease without esophagitis: Secondary | ICD-10-CM | POA: Diagnosis not present

## 2021-01-27 DIAGNOSIS — R0602 Shortness of breath: Secondary | ICD-10-CM | POA: Diagnosis not present

## 2021-01-27 DIAGNOSIS — R1310 Dysphagia, unspecified: Secondary | ICD-10-CM | POA: Diagnosis not present

## 2021-02-11 DIAGNOSIS — Z01419 Encounter for gynecological examination (general) (routine) without abnormal findings: Secondary | ICD-10-CM | POA: Diagnosis not present

## 2021-02-11 DIAGNOSIS — Z6831 Body mass index (BMI) 31.0-31.9, adult: Secondary | ICD-10-CM | POA: Diagnosis not present

## 2021-02-17 ENCOUNTER — Encounter (INDEPENDENT_AMBULATORY_CARE_PROVIDER_SITE_OTHER): Payer: Self-pay | Admitting: Internal Medicine

## 2021-02-17 ENCOUNTER — Other Ambulatory Visit (INDEPENDENT_AMBULATORY_CARE_PROVIDER_SITE_OTHER): Payer: Self-pay | Admitting: Internal Medicine

## 2021-03-29 ENCOUNTER — Other Ambulatory Visit (INDEPENDENT_AMBULATORY_CARE_PROVIDER_SITE_OTHER): Payer: Self-pay | Admitting: Internal Medicine

## 2021-04-07 ENCOUNTER — Encounter (INDEPENDENT_AMBULATORY_CARE_PROVIDER_SITE_OTHER): Payer: Self-pay | Admitting: Nurse Practitioner

## 2021-04-08 ENCOUNTER — Other Ambulatory Visit (INDEPENDENT_AMBULATORY_CARE_PROVIDER_SITE_OTHER): Payer: Self-pay | Admitting: Internal Medicine

## 2021-04-08 MED ORDER — PROGESTERONE 200 MG PO CAPS: 200.0000 mg | ORAL_CAPSULE | Freq: Every day | ORAL | 1 refills | Status: DC

## 2021-04-19 DIAGNOSIS — D1801 Hemangioma of skin and subcutaneous tissue: Secondary | ICD-10-CM | POA: Diagnosis not present

## 2021-04-19 DIAGNOSIS — R208 Other disturbances of skin sensation: Secondary | ICD-10-CM | POA: Diagnosis not present

## 2021-05-18 DIAGNOSIS — J342 Deviated nasal septum: Secondary | ICD-10-CM | POA: Diagnosis not present

## 2021-05-18 DIAGNOSIS — J3489 Other specified disorders of nose and nasal sinuses: Secondary | ICD-10-CM | POA: Diagnosis not present

## 2021-06-10 ENCOUNTER — Encounter (INDEPENDENT_AMBULATORY_CARE_PROVIDER_SITE_OTHER): Payer: Self-pay | Admitting: Nurse Practitioner

## 2021-06-10 ENCOUNTER — Other Ambulatory Visit: Payer: Self-pay

## 2021-06-10 ENCOUNTER — Ambulatory Visit (INDEPENDENT_AMBULATORY_CARE_PROVIDER_SITE_OTHER): Payer: BC Managed Care – PPO | Admitting: Nurse Practitioner

## 2021-06-10 VITALS — BP 116/80 | HR 71 | Temp 97.5°F | Ht 63.0 in | Wt 185.8 lb

## 2021-06-10 DIAGNOSIS — I10 Essential (primary) hypertension: Secondary | ICD-10-CM

## 2021-06-10 DIAGNOSIS — D751 Secondary polycythemia: Secondary | ICD-10-CM | POA: Diagnosis not present

## 2021-06-10 DIAGNOSIS — R5383 Other fatigue: Secondary | ICD-10-CM | POA: Diagnosis not present

## 2021-06-10 DIAGNOSIS — M2550 Pain in unspecified joint: Secondary | ICD-10-CM | POA: Diagnosis not present

## 2021-06-10 NOTE — Progress Notes (Signed)
Subjective:  Patient ID: VILA DORY, female    DOB: 22-May-1976  Age: 45 y.o. MRN: [id number]  CC:  Chief Complaint  Patient presents with   Follow-up    Doing okay, having surgery next month for deviated septum, has labs from natural doctor   Fatigue    And joint pain   Other    Erythrocytosis   Hypertension      HPI  This patient arrives today for the above.  Fatigue/joint pain: She sees a naturopathic doctor as well as [place] and she had some blood work done with them recently which showed positive ANA with titer of 1:80.  She is concerned that she may have a autoimmune disease.  She tells me she does have intermittent joint swelling/pain of her toes as well as joint pain in her large joints.  She is wondering if she possibly could be developing rheumatoid arthritis.  This is mostly due to the fact that she has a significant family history of rheumatoid arthritis.  Erythrocytosis: She has had mildly elevated red blood cells with normal hemoglobin on most recent lab work.  She had CBC collected when she went to see her natural doctor which did show mildly elevated red blood cells as well.  Hypertension: She continues on her antihypertensives and is tolerating them well.  Past Medical History:  Diagnosis Date   Essential hypertension, benign 11/12/2019   HLD (hyperlipidemia) 11/12/2019   Obesity (BMI 30.0-34.9) 11/12/2019   PMS (premenstrual syndrome) 11/12/2019   Thyroid disease    Vitamin D deficiency disease 11/12/2019      Family History  Problem Relation Age of Onset   Healthy Mother    Healthy Father    Allergic rhinitis Neg Hx    Angioedema Neg Hx    Asthma Neg Hx    Atopy Neg Hx    Eczema Neg Hx    Immunodeficiency Neg Hx    Urticaria Neg Hx     Social History   Social History Narrative   Divorced 19 years.Lives with her son.[employer] group.   Social History   Tobacco Use   Smoking status: Never   Smokeless tobacco: Never   Substance Use Topics   Alcohol use: Not Currently     Current Meds  Medication Sig   Bacillus Coagulans-Inulin (PROBIOTIC) 1-250 BILLION-MG CAPS Take 1 capsule by mouth daily.   Cholecalciferol (VITAMIN D-3) 125 MCG (5000 UT) TABS Take 1 tablet by mouth daily.    famotidine (PEPCID) 40 MG tablet Take 40 mg by mouth 2 (two) times daily.   hydrochlorothiazide (MICROZIDE) 12.5 MG capsule Take 1 capsule by mouth once daily   levocetirizine (XYZAL) 5 MG tablet Take 1 tablet (5 mg total) by mouth every evening. (Patient taking differently: Take 5 mg by mouth once as needed.)   Magnesium 500 MG CAPS Take 1 capsule by mouth daily.   OVER THE COUNTER MEDICATION Adapticin 1 tablet daily   progesterone (PROMETRIUM) 200 MG capsule Take 1 capsule (200 mg total) by mouth daily.   UNABLE TO FIND Take 2 capsules by mouth daily. Med Name: Thyroxal   UNABLE TO FIND Take 1 capsule by mouth daily. Med Name: Glutithione    ROS:  See HPI   Objective:   Today's Vitals: BP 116/80   Pulse 71   Temp (!) 97.5 F (36.4 C) (Temporal)   Ht 5\' 3"  (1.6 m)   Wt 185 lb 12.8 oz (84.3 kg)   SpO2  99%   BMI 32.91 kg/m  Vitals with BMI 06/10/2021 01/20/2021 06/17/2020  Height 5\' 3"  5\' 3"  5\' 4"   Weight 185 lbs 13 oz 182 lbs 2 oz 182 lbs 13 oz  BMI 32.92 32.27 31.36  Systolic 116 115  Diastolic 80 90 80  Pulse 71 71 76     Physical Exam Vitals reviewed.  Constitutional:      General: She is not in acute distress.    Appearance: Normal appearance.  HENT:     Head: Normocephalic and atraumatic.  Neck:     Vascular: No carotid bruit.  Cardiovascular:     Rate and Rhythm: Normal rate and regular rhythm.     Pulses: Normal pulses.     Heart sounds: Normal heart sounds.  Pulmonary:     Effort: Pulmonary effort is normal.     Breath sounds: Normal breath sounds.  Skin:    General: Skin is warm and dry.  Neurological:     General: No focal deficit present.     Mental Status: She is alert and oriented  to person, place, and time.  Psychiatric:        Mood and Affect: Mood normal.        Behavior: Behavior normal.        Judgment: Judgment normal.         Assessment and Plan   1. Arthralgia, unspecified joint   2. Essential hypertension, benign   3. Erythrocytosis   4. Fatigue, unspecified type      Plan: 1.,  4.  We will check rheumatoid factor as well as anti-CCP level for further evaluation of possible rheumatoid arthritis.  If this comes back negative may consider sending to rheumatology for further work-up of possible autoimmune disorder. 2.  Blood pressure well controlled on current regimen she will continue taking her medications as prescribed. 3.  I do not think there is any need for further work-up at this time however we will continue to monitor this.  If red blood cell counts continue to increase may need to consider hematology referral.   Tests ordered Orders Placed This Encounter  Procedures   Rheumatoid Factor   Cyclic citrul peptide antibody, IgG      No orders of the defined types were placed in this encounter.   Patient to follow-up in 3 months or sooner as needed.  , NP

## 2021-06-11 LAB — RHEUMATOID FACTOR: Rheumatoid fact SerPl-aCnc: <14 IU/mL (ref <14)

## 2021-06-11 LAB — CYCLIC CITRUL PEPTIDE ANTIBODY, IGG: Cyclic Citrullin Peptide Ab: <16 UNITS

## 2021-06-15 ENCOUNTER — Encounter (INDEPENDENT_AMBULATORY_CARE_PROVIDER_SITE_OTHER): Payer: Self-pay | Admitting: Nurse Practitioner

## 2021-06-15 DIAGNOSIS — J342 Deviated nasal septum: Secondary | ICD-10-CM | POA: Diagnosis not present

## 2021-06-24 DIAGNOSIS — Z23 Encounter for immunization: Secondary | ICD-10-CM | POA: Diagnosis not present

## 2021-06-30 DIAGNOSIS — J320 Chronic maxillary sinusitis: Secondary | ICD-10-CM | POA: Diagnosis not present

## 2021-06-30 DIAGNOSIS — J342 Deviated nasal septum: Secondary | ICD-10-CM | POA: Diagnosis not present

## 2021-06-30 DIAGNOSIS — J3489 Other specified disorders of nose and nasal sinuses: Secondary | ICD-10-CM | POA: Diagnosis not present

## 2021-06-30 DIAGNOSIS — J329 Chronic sinusitis, unspecified: Secondary | ICD-10-CM | POA: Diagnosis not present

## 2021-06-30 DIAGNOSIS — J343 Hypertrophy of nasal turbinates: Secondary | ICD-10-CM | POA: Diagnosis not present

## 2021-06-30 DIAGNOSIS — K449 Diaphragmatic hernia without obstruction or gangrene: Secondary | ICD-10-CM | POA: Diagnosis not present

## 2021-07-19 ENCOUNTER — Encounter (INDEPENDENT_AMBULATORY_CARE_PROVIDER_SITE_OTHER): Payer: Self-pay | Admitting: Nurse Practitioner

## 2021-07-21 ENCOUNTER — Ambulatory Visit (INDEPENDENT_AMBULATORY_CARE_PROVIDER_SITE_OTHER): Payer: BC Managed Care – PPO | Admitting: Nurse Practitioner

## 2021-07-29 ENCOUNTER — Other Ambulatory Visit (INDEPENDENT_AMBULATORY_CARE_PROVIDER_SITE_OTHER): Payer: Self-pay | Admitting: Nurse Practitioner

## 2021-07-29 ENCOUNTER — Telehealth (INDEPENDENT_AMBULATORY_CARE_PROVIDER_SITE_OTHER): Payer: Self-pay | Admitting: Nurse Practitioner

## 2021-07-29 DIAGNOSIS — E8881 Metabolic syndrome: Secondary | ICD-10-CM

## 2021-07-29 NOTE — Telephone Encounter (Signed)
FYI I have ordered referral to endocrinology and patient would like to see Dr. Lesly Dukes at [facility] if possible.

## 2021-08-06 DIAGNOSIS — G4700 Insomnia, unspecified: Secondary | ICD-10-CM | POA: Diagnosis not present

## 2021-08-06 DIAGNOSIS — I10 Essential (primary) hypertension: Secondary | ICD-10-CM | POA: Diagnosis not present

## 2021-08-06 DIAGNOSIS — R7989 Other specified abnormal findings of blood chemistry: Secondary | ICD-10-CM | POA: Diagnosis not present

## 2021-08-06 DIAGNOSIS — R5383 Other fatigue: Secondary | ICD-10-CM | POA: Diagnosis not present

## 2021-08-27 ENCOUNTER — Telehealth: Payer: Self-pay

## 2021-08-27 NOTE — Telephone Encounter (Signed)
Dr Fransico Him, This patient is scheduled to see [facility] on Monday as a new patient for insulin resistance. She said she feels she should see you, can you review chart and see if you can? I would need to move her if so, because you already have a 1:30 pm new patient on Monday.

## 2021-08-30 ENCOUNTER — Ambulatory Visit: Payer: BC Managed Care – PPO | Admitting: Nurse Practitioner

## 2021-09-03 ENCOUNTER — Other Ambulatory Visit: Payer: Self-pay

## 2021-09-03 ENCOUNTER — Encounter: Payer: BC Managed Care – PPO | Admitting: "Endocrinology

## 2021-09-08 ENCOUNTER — Encounter: Payer: Self-pay | Admitting: "Endocrinology

## 2021-09-08 NOTE — Progress Notes (Signed)
She decided that it was waste of her time to discuss anything. She did not have any endocrine deficit to treat right away. She would not agree with more work up and follow up, finally she decided decided to cancel the visit.

## 2021-09-15 ENCOUNTER — Ambulatory Visit (INDEPENDENT_AMBULATORY_CARE_PROVIDER_SITE_OTHER): Payer: BC Managed Care – PPO | Admitting: Internal Medicine

## 2021-09-22 DIAGNOSIS — N946 Dysmenorrhea, unspecified: Secondary | ICD-10-CM | POA: Diagnosis not present

## 2021-09-22 DIAGNOSIS — R609 Edema, unspecified: Secondary | ICD-10-CM | POA: Diagnosis not present

## 2021-09-22 DIAGNOSIS — I10 Essential (primary) hypertension: Secondary | ICD-10-CM | POA: Diagnosis not present

## 2021-10-04 DIAGNOSIS — R5383 Other fatigue: Secondary | ICD-10-CM | POA: Diagnosis not present

## 2021-10-04 DIAGNOSIS — E039 Hypothyroidism, unspecified: Secondary | ICD-10-CM | POA: Diagnosis not present

## 2021-10-04 DIAGNOSIS — N920 Excessive and frequent menstruation with regular cycle: Secondary | ICD-10-CM | POA: Diagnosis not present

## 2021-11-11 DIAGNOSIS — Z9889 Other specified postprocedural states: Secondary | ICD-10-CM | POA: Diagnosis not present

## 2021-11-11 DIAGNOSIS — J00 Acute nasopharyngitis [common cold]: Secondary | ICD-10-CM | POA: Diagnosis not present

## 2021-11-15 DIAGNOSIS — E0590 Thyrotoxicosis, unspecified without thyrotoxic crisis or storm: Secondary | ICD-10-CM | POA: Diagnosis not present

## 2021-11-15 DIAGNOSIS — R7989 Other specified abnormal findings of blood chemistry: Secondary | ICD-10-CM | POA: Diagnosis not present

## 2021-11-23 DIAGNOSIS — E6609 Other obesity due to excess calories: Secondary | ICD-10-CM | POA: Diagnosis not present

## 2021-11-23 DIAGNOSIS — Z6833 Body mass index (BMI) 33.0-33.9, adult: Secondary | ICD-10-CM | POA: Diagnosis not present

## 2021-11-30 DIAGNOSIS — E669 Obesity, unspecified: Secondary | ICD-10-CM | POA: Diagnosis not present

## 2021-11-30 DIAGNOSIS — Z713 Dietary counseling and surveillance: Secondary | ICD-10-CM | POA: Diagnosis not present

## 2021-11-30 DIAGNOSIS — Z6833 Body mass index (BMI) 33.0-33.9, adult: Secondary | ICD-10-CM | POA: Diagnosis not present

## 2021-12-07 DIAGNOSIS — E559 Vitamin D deficiency, unspecified: Secondary | ICD-10-CM | POA: Diagnosis not present

## 2021-12-07 DIAGNOSIS — R609 Edema, unspecified: Secondary | ICD-10-CM | POA: Diagnosis not present

## 2021-12-07 DIAGNOSIS — J302 Other seasonal allergic rhinitis: Secondary | ICD-10-CM | POA: Diagnosis not present

## 2021-12-07 DIAGNOSIS — E785 Hyperlipidemia, unspecified: Secondary | ICD-10-CM | POA: Diagnosis not present

## 2021-12-16 DIAGNOSIS — S233XXA Sprain of ligaments of thoracic spine, initial encounter: Secondary | ICD-10-CM | POA: Diagnosis not present

## 2021-12-16 DIAGNOSIS — S134XXA Sprain of ligaments of cervical spine, initial encounter: Secondary | ICD-10-CM | POA: Diagnosis not present

## 2021-12-16 DIAGNOSIS — S338XXA Sprain of other parts of lumbar spine and pelvis, initial encounter: Secondary | ICD-10-CM | POA: Diagnosis not present

## 2021-12-27 DIAGNOSIS — S338XXA Sprain of other parts of lumbar spine and pelvis, initial encounter: Secondary | ICD-10-CM | POA: Diagnosis not present

## 2021-12-27 DIAGNOSIS — S233XXA Sprain of ligaments of thoracic spine, initial encounter: Secondary | ICD-10-CM | POA: Diagnosis not present

## 2021-12-27 DIAGNOSIS — S134XXA Sprain of ligaments of cervical spine, initial encounter: Secondary | ICD-10-CM | POA: Diagnosis not present

## 2021-12-30 IMAGING — RF DG ESOPHAGUS
8 series · 12 of 24 positions shown · non-contrast
Comparison: None

CLINICAL DATA: Shortness of breath, questionable history of hiatal
hernia, GERD on medication, occasional choking and food sticking

EXAM:
ESOPHOGRAM / BARIUM SWALLOW / BARIUM TABLET STUDY
TECHNIQUE: Combined double contrast and single contrast examination performed
using effervescent crystals, thick barium liquid, and thin barium
liquid. The patient was observed with fluoroscopy swallowing a 13 mm
barium sulphate tablet.
FLUOROSCOPY TIME:  Fluoroscopy Time:  1 minutes 30 seconds
Radiation Exposure Index (if provided by the fluoroscopic device):
26.2 mGy
Number of Acquired Spot Images: multiple fluoroscopic screen
captures

[Series 1: cp_standard · 0.18mm/px · 1 of 86 frames shown (1 of 8)]
[frame 44/86]
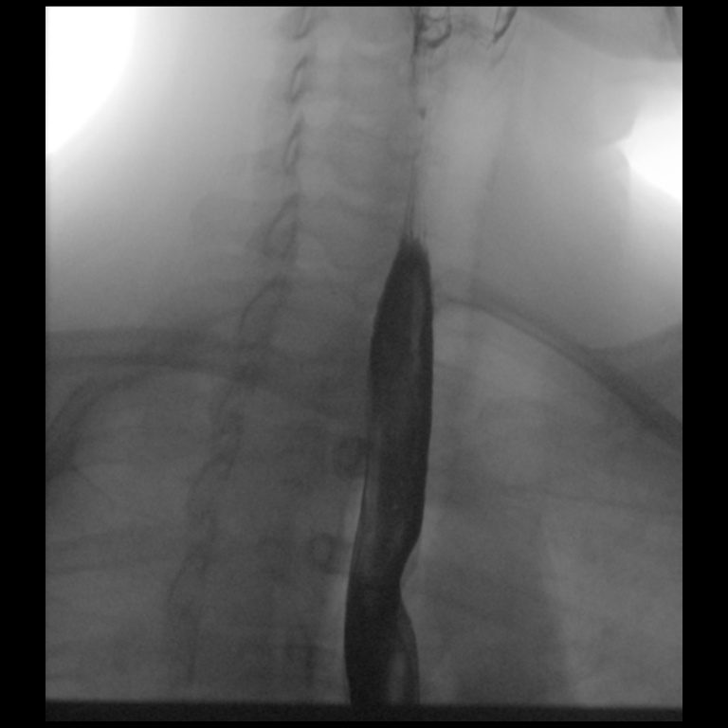

[Series 2: cp_standard · 0.18mm/px · 2 of 92 frames shown (2 of 8)]
[frame 6/92]
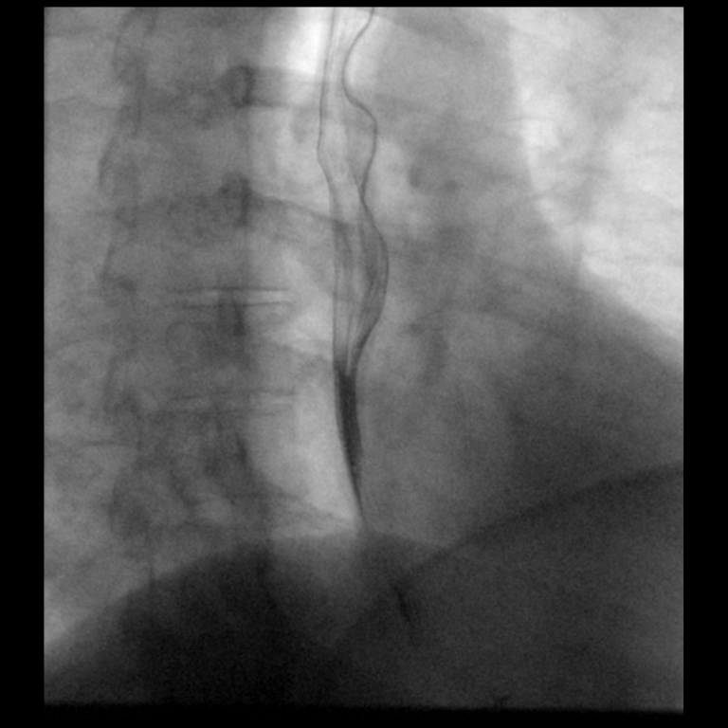
[frame 79/92]
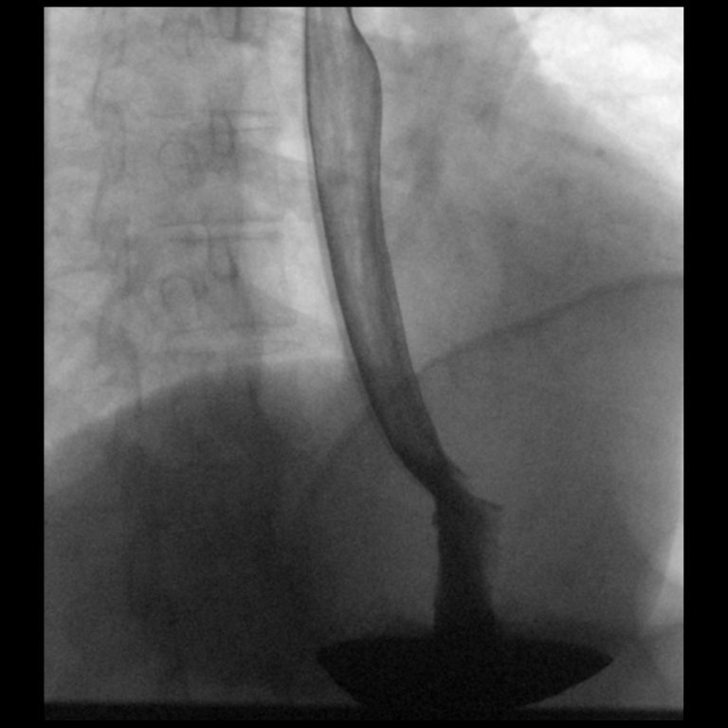

[Series 3: cp_standard · 0.17mm/px · 1 of 109 frames shown (3 of 8)]
[frame 55/109]
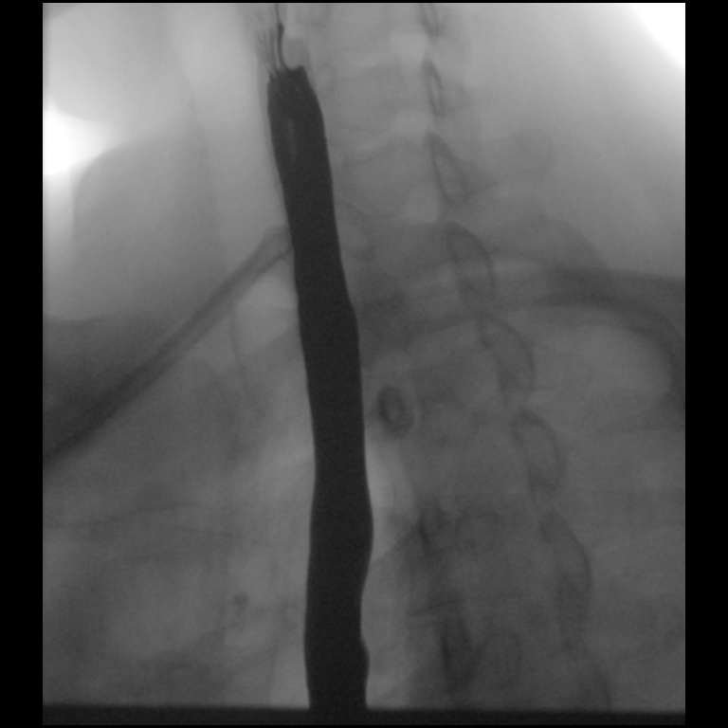

[Series 4: cp_standard · 0.17mm/px · 2 of 100 frames shown (4 of 8)]
[frame 16/100]
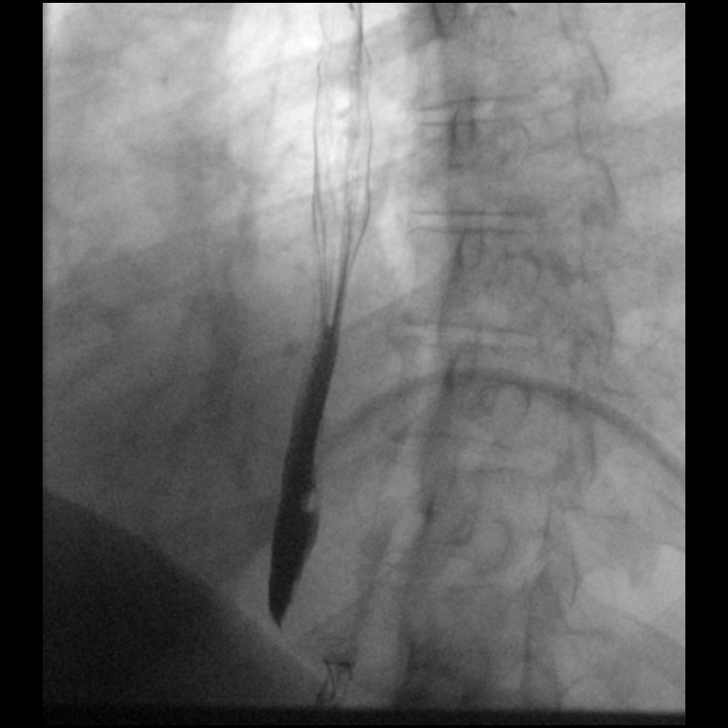
[frame 100/100]
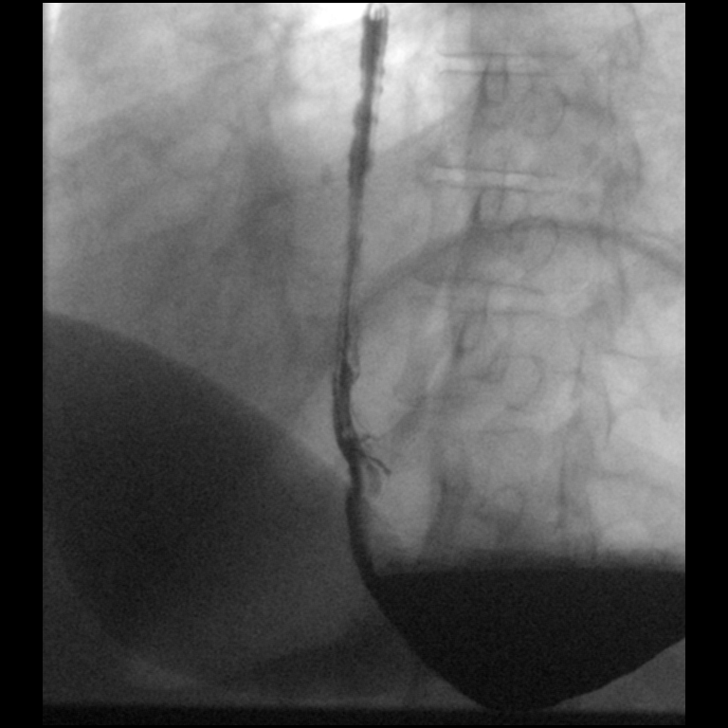

[Series 5: cp_standard · 0.27mm/px · 1 of 157 frames shown (5 of 8)]
[frame 79/157]
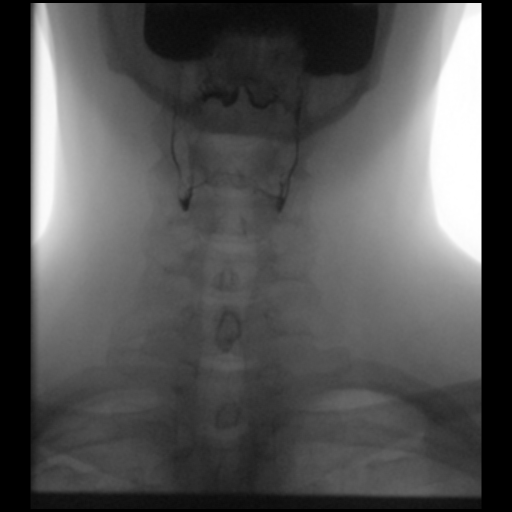

[Series 6: cp_standard · 0.25mm/px · 2 of 90 frames shown (6 of 8)]
[frame 14/90]
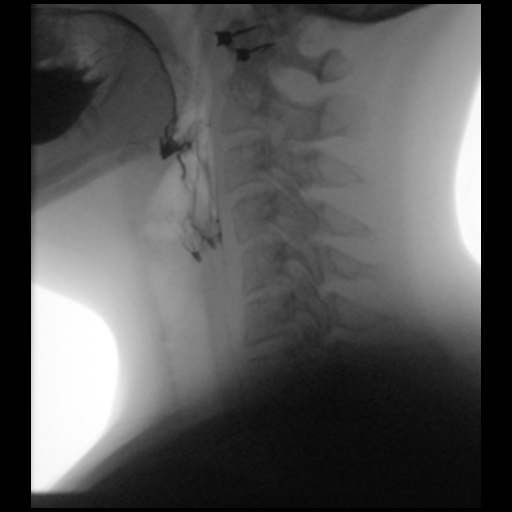
[frame 90/90]
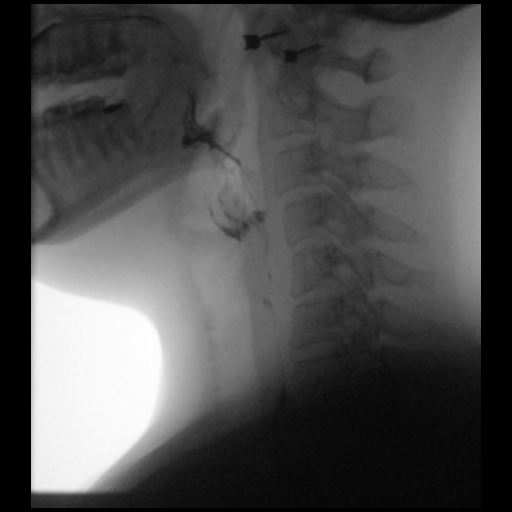

[Series 7: cp_standard · 0.18mm/px · 1 of 102 frames shown (7 of 8)]
[frame 83/102]
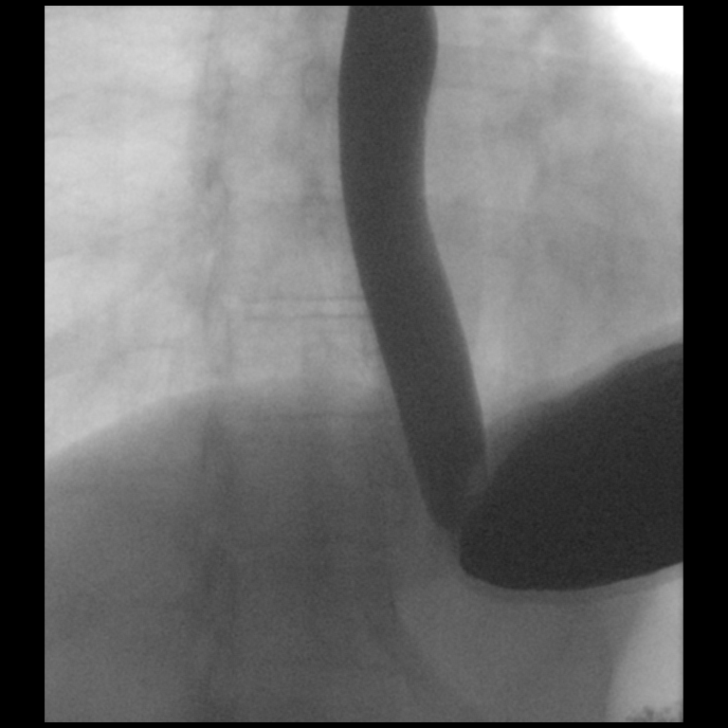

[Series 8: cp_standard · 0.18mm/px · 2 of 112 frames shown (8 of 8)]
[frame 17/112]
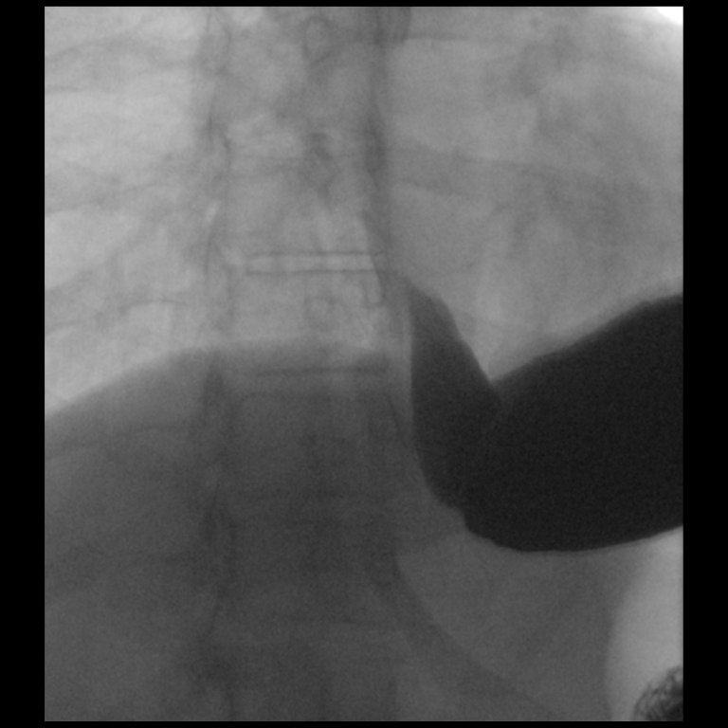
[frame 96/112]
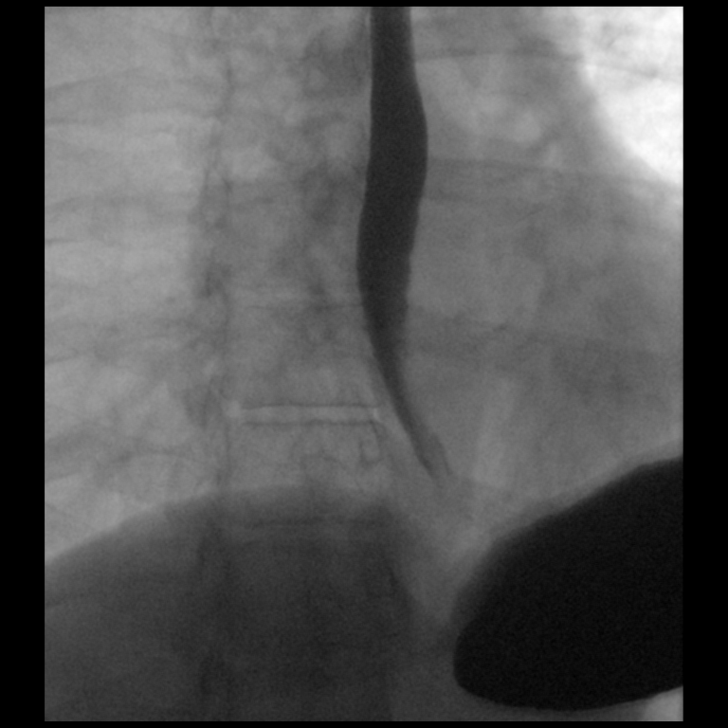

[12 of 24 positions shown; findings below may reference images not displayed]

FINDINGS: Esophageal distention: Normal without mass or stricture

Filling defects:  None

12.5 mm barium tablet: Easily passed from oral cavity to stomach
without obstruction

Motility:  Normal

Mucosa:  Smooth without irregularity or ulceration

Hypopharynx/cervical esophagus: Normal. No laryngeal penetration or
aspiration. No residuals.

Hiatal hernia:  Absent

GE reflux:  Not witnessed during exam

Other:  N/A
IMPRESSION: Normal exam.

## 2022-01-05 DIAGNOSIS — S134XXA Sprain of ligaments of cervical spine, initial encounter: Secondary | ICD-10-CM | POA: Diagnosis not present

## 2022-01-05 DIAGNOSIS — S338XXA Sprain of other parts of lumbar spine and pelvis, initial encounter: Secondary | ICD-10-CM | POA: Diagnosis not present

## 2022-01-05 DIAGNOSIS — S233XXA Sprain of ligaments of thoracic spine, initial encounter: Secondary | ICD-10-CM | POA: Diagnosis not present

## 2022-01-12 DIAGNOSIS — Z0000 Encounter for general adult medical examination without abnormal findings: Secondary | ICD-10-CM | POA: Diagnosis not present

## 2022-01-12 DIAGNOSIS — Z131 Encounter for screening for diabetes mellitus: Secondary | ICD-10-CM | POA: Diagnosis not present

## 2022-01-12 DIAGNOSIS — R7989 Other specified abnormal findings of blood chemistry: Secondary | ICD-10-CM | POA: Diagnosis not present

## 2022-01-19 DIAGNOSIS — S338XXA Sprain of other parts of lumbar spine and pelvis, initial encounter: Secondary | ICD-10-CM | POA: Diagnosis not present

## 2022-01-19 DIAGNOSIS — S134XXA Sprain of ligaments of cervical spine, initial encounter: Secondary | ICD-10-CM | POA: Diagnosis not present

## 2022-01-19 DIAGNOSIS — S233XXA Sprain of ligaments of thoracic spine, initial encounter: Secondary | ICD-10-CM | POA: Diagnosis not present

## 2022-01-28 ENCOUNTER — Encounter: Payer: Self-pay | Admitting: Nurse Practitioner

## 2022-02-02 DIAGNOSIS — S233XXA Sprain of ligaments of thoracic spine, initial encounter: Secondary | ICD-10-CM | POA: Diagnosis not present

## 2022-02-02 DIAGNOSIS — S134XXA Sprain of ligaments of cervical spine, initial encounter: Secondary | ICD-10-CM | POA: Diagnosis not present

## 2022-02-02 DIAGNOSIS — S338XXA Sprain of other parts of lumbar spine and pelvis, initial encounter: Secondary | ICD-10-CM | POA: Diagnosis not present

## 2022-02-03 ENCOUNTER — Ambulatory Visit: Payer: BC Managed Care – PPO | Admitting: Nurse Practitioner

## 2022-02-03 ENCOUNTER — Other Ambulatory Visit: Payer: Self-pay

## 2022-02-03 ENCOUNTER — Encounter: Payer: Self-pay | Admitting: Nurse Practitioner

## 2022-02-03 VITALS — BP 118/72 | HR 70 | Temp 98.3°F | Ht 63.0 in | Wt 192.8 lb

## 2022-02-03 DIAGNOSIS — R768 Other specified abnormal immunological findings in serum: Secondary | ICD-10-CM | POA: Diagnosis not present

## 2022-02-03 DIAGNOSIS — H8110 Benign paroxysmal vertigo, unspecified ear: Secondary | ICD-10-CM | POA: Diagnosis not present

## 2022-02-03 DIAGNOSIS — R5383 Other fatigue: Secondary | ICD-10-CM

## 2022-02-03 DIAGNOSIS — R42 Dizziness and giddiness: Secondary | ICD-10-CM | POA: Diagnosis not present

## 2022-02-03 DIAGNOSIS — M25572 Pain in left ankle and joints of left foot: Secondary | ICD-10-CM

## 2022-02-03 MED ORDER — MECLIZINE HCL 25 MG PO TABS: 25.0000 mg | ORAL_TABLET | Freq: Three times a day (TID) | ORAL | 0 refills | Status: DC | PRN

## 2022-02-03 NOTE — Progress Notes (Signed)
Subjective:  Patient ID: Nichole Zamora, female    DOB: 06-09-1976  Age: 46 y.o. MRN: [id number]  CC:  Chief Complaint  Patient presents with   Dizziness    Since Saturday she has been having dizzy spells. Pt describes feeling as "feels like a blood sugar drop" but mostly happens when she goes from laying down to standing or standing to laying down.       HPI  This patient arrives today for the above.  Dizziness: She experienced a dizziness episode on Saturday, and since then has had some intermittent recurrence of it.  It seems to occur when she is either laying down or getting up from laying down.  The episodes last for about 45 seconds and spontaneously resolved.  Since Saturday the episodes were getting progressively less frequent and did not seem to bother her as much.  She did go to the chiropractor yesterday and at 1 point she had to turn her head to the side and stand up which seem to exacerbate the symptoms again.  Again this spontaneously resolved.  She denies any feelings of disequilibrium, chest pain, shortness of breath, loss of consciousness, seizures, visual changes, or hearing loss.  Left ankle pain: This has been occurring since Sunday.  No traumatic events preceding the pain.  Only occurs with inversion and eversion of the ankle.  When the pain occurs she describes it as burning and the intensity will wax and wane but gets up to 7 out of 10.  When she stops certain range of motion's or pressure that seems to be causing the pain and the pain will resolve.  She denies any laxity or feeling like her ankle is getting give out.  Possible autoimmune disorder: She also concerned she may have a possible autoimmune disorder such as lupus.  She has had ANA checked twice by previous provider.  First was in 4/21 and ANA was positive at that time with a titer of 1: 80 and described as nuclear homogenous.  Second time she was checked was in 4/22 at that time ANA was positive with  titer of 1: 80 and described as nuclear homogenous.  She has a lot of sensitivity to the cold in intermittent fatigue.  She has considered being evaluated by rheumatology in the past but has not had official evaluation, is requesting 1 now.  Past Medical History:  Diagnosis Date   Essential hypertension, benign 11/12/2019   HLD (hyperlipidemia) 11/12/2019   Obesity (BMI 30.0-34.9) 11/12/2019   PMS (premenstrual syndrome) 11/12/2019   Thyroid disease    Vitamin D deficiency disease 11/12/2019      Family History  Problem Relation Age of Onset   Healthy Mother    Healthy Father    Allergic rhinitis Neg Hx    Angioedema Neg Hx    Asthma Neg Hx    Atopy Neg Hx    Eczema Neg Hx    Immunodeficiency Neg Hx    Urticaria Neg Hx     Social History   Social History Narrative   Divorced 19 years.Lives with her son. [employer] group.   Social History   Tobacco Use   Smoking status: Never   Smokeless tobacco: Never  Substance Use Topics   Alcohol use: Not Currently     Current Meds  Medication Sig   Bacillus Coagulans-Inulin (PROBIOTIC) 1-250 BILLION-MG CAPS Take 1 capsule by mouth daily.   Cholecalciferol (VITAMIN D-3) 125 MCG (5000 UT) TABS Take 1 tablet  by mouth daily.    Cobalamin Combinations (VITAMIN B12-FOLIC ACID PO) Take 1 tablet by mouth daily.   famotidine (PEPCID) 40 MG tablet Take 40 mg by mouth daily.   hydrochlorothiazide (MICROZIDE) 12.5 MG capsule Take 1 capsule by mouth once daily   Magnesium 500 MG CAPS Take 1 capsule by mouth daily.   meclizine (ANTIVERT) 25 MG tablet Take 1 tablet (25 mg total) by mouth 3 (three) times daily as needed for dizziness.   OVER THE COUNTER MEDICATION Adapticin 1 tablet daily   progesterone (PROMETRIUM) 200 MG capsule Take 1 capsule (200 mg total) by mouth daily. (Patient taking differently: Take 200 mg by mouth daily as needed.)   UNABLE TO FIND Take 2 capsules by mouth daily. Med Name: Thyroxal   UNABLE TO FIND Take 1  capsule by mouth daily. Med Name: Glutithione    ROS:  Review of Systems  HENT:  Positive for hearing loss (chronic).   Eyes:  Negative for blurred vision and double vision.  Respiratory:  Negative for shortness of breath.   Cardiovascular:  Negative for chest pain.  Neurological:  Positive for dizziness. Negative for seizures and loss of consciousness.    Objective:   Today's Vitals: BP 118/72 (BP Location: Right Arm, Patient Position: Sitting, Cuff Size: Large)    Pulse 70    Temp 98.3 F (36.8 C) (Oral)    Ht 5\' 3"  (1.6 m)    Wt 192 lb 12.8 oz (87.5 kg)    SpO2 99%    BMI 34.15 kg/m  Vitals with BMI 02/03/2022 09/03/2021 06/10/2021  Height 5\' 3"  5\' 3"  5\' 3"   Weight 192 lbs 13 oz 188 lbs 6 oz 185 lbs 13 oz  BMI 34.16 0000000 [id number]  Systolic 123456 123456 [id number]  Diastolic 72 78 80  Pulse 70 64 71     Physical Exam Vitals reviewed.  Constitutional:      General: She is not in acute distress.    Appearance: Normal appearance.  HENT:     Head: Normocephalic and atraumatic.  Neck:     Vascular: No carotid bruit.  Cardiovascular:     Rate and Rhythm: Normal rate and regular rhythm.     Pulses: Normal pulses.     Heart sounds: Normal heart sounds.  Pulmonary:     Effort: Pulmonary effort is normal.     Breath sounds: Normal breath sounds.  Musculoskeletal:     Right ankle: Normal. No swelling. Normal range of motion.     Left ankle: No swelling. Tenderness (mild with deep palpation) present. Normal range of motion.       Legs:  Skin:    General: Skin is warm and dry.  Neurological:     General: No focal deficit present.     Mental Status: She is alert and oriented to person, place, and time.  Psychiatric:        Mood and Affect: Mood normal.        Behavior: Behavior normal.        Judgment: Judgment normal.         Assessment and Plan   1. Fatigue, unspecified type   2. Positive ANA (antinuclear antibody)   3. Dizziness   4. Benign paroxysmal positional vertigo,  unspecified laterality   5. Acute left ankle pain      Plan: 1.,  2.  We will refer to rheumatology for evaluation of possible autoimmune disease, patient is especially concerned about lupus. 3.-4.  I think  her dizziness is BPPV.  For now recommend she take meclizine as needed.  She was told if symptoms progress or worsen especially she is experiencing other neurological signs she should notify us at which point may consider additional evaluation.  She voices her understanding. 5.  No bony tenderness today, so will not do x-ray.  But for now we will treat as soft tissue injury and treat symptoms.  Recommended she rest the extremity, use over-the-counter medications like Advil or ibuprofen or Tylenol, consider compression wrap or ankle brace, and let me know if symptoms worsen or progress over the next few weeks.  May consider MRI or referral to sports medicine if symptoms persist.    Tests ordered Orders Placed This Encounter  Procedures   Ambulatory referral to Rheumatology      Meds ordered this encounter  Medications   meclizine (ANTIVERT) 25 MG tablet    Sig: Take 1 tablet (25 mg total) by mouth 3 (three) times daily as needed for dizziness.    Dispense:  30 tablet    Refill:  0    Order Specific Question:   Supervising Provider    Answer:   Binnie Rail [id number]    Patient to follow-up in 3-6 months or sooner as needed  Ailene Ards, NP

## 2022-02-10 ENCOUNTER — Encounter: Payer: Self-pay | Admitting: Nurse Practitioner

## 2022-02-16 DIAGNOSIS — S338XXA Sprain of other parts of lumbar spine and pelvis, initial encounter: Secondary | ICD-10-CM | POA: Diagnosis not present

## 2022-02-16 DIAGNOSIS — S233XXA Sprain of ligaments of thoracic spine, initial encounter: Secondary | ICD-10-CM | POA: Diagnosis not present

## 2022-02-16 DIAGNOSIS — S134XXA Sprain of ligaments of cervical spine, initial encounter: Secondary | ICD-10-CM | POA: Diagnosis not present

## 2022-03-02 DIAGNOSIS — S134XXA Sprain of ligaments of cervical spine, initial encounter: Secondary | ICD-10-CM | POA: Diagnosis not present

## 2022-03-02 DIAGNOSIS — S233XXA Sprain of ligaments of thoracic spine, initial encounter: Secondary | ICD-10-CM | POA: Diagnosis not present

## 2022-03-02 DIAGNOSIS — S338XXA Sprain of other parts of lumbar spine and pelvis, initial encounter: Secondary | ICD-10-CM | POA: Diagnosis not present

## 2022-03-16 DIAGNOSIS — S338XXA Sprain of other parts of lumbar spine and pelvis, initial encounter: Secondary | ICD-10-CM | POA: Diagnosis not present

## 2022-03-16 DIAGNOSIS — S134XXA Sprain of ligaments of cervical spine, initial encounter: Secondary | ICD-10-CM | POA: Diagnosis not present

## 2022-03-16 DIAGNOSIS — S233XXA Sprain of ligaments of thoracic spine, initial encounter: Secondary | ICD-10-CM | POA: Diagnosis not present

## 2022-03-30 DIAGNOSIS — Z1231 Encounter for screening mammogram for malignant neoplasm of breast: Secondary | ICD-10-CM | POA: Diagnosis not present

## 2022-04-12 NOTE — Progress Notes (Signed)
Office Visit Note  Patient: Nichole Zamora             Date of Birth: [birth year]           MRN: [id number]             PCP: [name], NP Referring: [name], NP Visit Date: 04/13/2022  Subjective:  New Patient (Initial Visit) (Abnormal labs)   History of Present Illness: Nichole Zamora is a 46 y.o. female here for evaluation of persistent low positive ANA serology for 2 or 3 years with chronic fatigue. She has been feeling fatigue requiring afternoon napping and with low energy since at least 5 years ago. She had workup for hypothyroidism with inconsistently abnormal TSH T3 and T4 often in normal range and negative for anti-TPO Abs. She did not have a very great improvement on treatment initially but has resumed this medication more recently and feels partially better. She sleeps well half the time and with more difficulty half the time, based on when she is taking progesterone 2 weeks on 2 weeks off. Outside of fatigue she also notices more frequent discoloration in her fingers often with blue appearance in her fingertips and nails with cold exposure these fully resolve after a few minutes in warm temperature. She notices some generalized hair thinning or falling out but no focal loss or bald spots. She is very sensitive to sun exposure with easy burning and many freckles and sun spots. She notices some nontender bumps on the inside of her lips lasting for several days at a time, no nasal ulcers or lesions.  Labs reviewed 03/3022 ANA 1:80 homogenous  05/2021 RF neg CCP neg  03/2021 ANA 1:80 homogenous  Activities of Daily Living:  Patient reports morning stiffness for 0  none .   Patient Denies nocturnal pain.  Difficulty dressing/grooming: Denies Difficulty climbing stairs: Denies Difficulty getting out of chair: Denies Difficulty using hands for taps, buttons, cutlery, and/or writing: Denies  Review of Systems  Constitutional:  Positive for fatigue.  HENT:   Positive for mouth dryness.   Eyes:  Positive for dryness.  Respiratory:  Positive for shortness of breath.   Cardiovascular:  Positive for swelling in legs/feet.  Gastrointestinal:  Positive for constipation and diarrhea.  Endocrine: Positive for cold intolerance.  Genitourinary:  Negative for difficulty urinating.  Musculoskeletal:  Positive for joint pain, gait problem and joint pain.  Skin:  Negative for rash.  Allergic/Immunologic: Negative for susceptible to infections.  Neurological:  Negative for numbness.  Hematological:  Negative for bruising/bleeding tendency.  Psychiatric/Behavioral:  Positive for sleep disturbance.    PMFS History:  Patient Active Problem List   Diagnosis Date Noted   Positive ANA (antinuclear antibody) 04/13/2022   Other fatigue 04/13/2022   Hypothyroidism 01/20/2020   Essential hypertension, benign 11/12/2019   PMS (premenstrual syndrome) 11/12/2019   Obesity (BMI 30.0-34.9) 11/12/2019   Generalized headaches 02/09/2016    Past Medical History:  Diagnosis Date   Essential hypertension, benign 11/12/2019   HLD (hyperlipidemia) 11/12/2019   Obesity (BMI 30.0-34.9) 11/12/2019   PMS (premenstrual syndrome) 11/12/2019   Thyroid disease    Vitamin D deficiency disease 11/12/2019    Family History  Problem Relation Age of Onset   Diabetes Mother    Diabetes Father    Diabetes Brother    Allergic rhinitis Neg Hx    Angioedema Neg Hx    Asthma Neg Hx    Atopy Neg Hx  Eczema Neg Hx    Immunodeficiency Neg Hx    Urticaria Neg Hx    Past Surgical History:  Procedure Laterality Date   LAMINECTOMY  2012   SEPTOPLASTY     TUBAL LIGATION  2004   Social History   Social History Narrative   Divorced 19 years.Lives with her son.[employer].   Immunization History  Administered Date(s) Administered   Moderna Sars-Covid-2 Vaccination 02/10/2020, 02/19/2020, 03/12/2020, 03/16/2020, 09/11/2020, 10/04/2020, 06/24/2021   Tdap  02/12/2014   Unspecified SARS-COV-2 Vaccination 02/19/2020, 03/16/2020, 10/04/2020, 06/24/2021     Objective: Vital Signs: BP 136/86 (BP Location: Right Arm, Patient Position: Sitting, Cuff Size: Normal)   Pulse 83   Resp 16   Ht 5\' 4"  (1.626 m)   Wt 198 lb (89.8 kg)   BMI 33.99 kg/m    Physical Exam Constitutional:      Appearance: She is obese.  HENT:     Right Ear: External ear normal.     Left Ear: External ear normal.     Mouth/Throat:     Mouth: Mucous membranes are moist.     Pharynx: Oropharynx is clear.  Eyes:     Conjunctiva/sclera: Conjunctivae normal.     Comments: Scarring on left sided sclera both sides  Cardiovascular:     Rate and Rhythm: Normal rate and regular rhythm.  Pulmonary:     Effort: Pulmonary effort is normal.     Breath sounds: Normal breath sounds.  Musculoskeletal:     Right lower leg: No edema.     Left lower leg: No edema.  Skin:    General: Skin is warm and dry.     Findings: No rash.  Neurological:     Mental Status: She is alert.  Psychiatric:        Mood and Affect: Mood normal.     Musculoskeletal Exam:  Shoulders full ROM no tenderness or swelling Elbows full ROM no tenderness or swelling Wrists full ROM no tenderness or swelling Fingers full ROM no tenderness or swelling Left hip anterior pain provoked with FADIR movement no lateral hip tenderness to palpation, internal and external rotation normal Knees full ROM no tenderness or swelling Ankles full ROM no tenderness or swelling   Investigation: No additional findings.  Imaging: No results found.  Recent Labs: Lab Results  Component Value Date   WBC 10.6 12/30/2020   HGB 15.1 12/30/2020   PLT 290 12/30/2020   NA 138 12/30/2020   K 3.9 12/30/2020   CL 101 12/30/2020   CO2 26 12/30/2020   GLUCOSE 83 12/30/2020   BUN 11 12/30/2020   CREATININE 0.85 12/30/2020   BILITOT 1.2 12/30/2020   AST 16 12/30/2020   ALT 12 12/30/2020   PROT 7.3 12/30/2020   CALCIUM  9.8 12/30/2020   GFRAA 97 12/30/2020    Speciality Comments: No specialty comments available.  Procedures:  No procedures performed Allergies: Codeine   Assessment / Plan:     Visit Diagnoses: Positive ANA (antinuclear antibody)  Other fatigue - Plan: RNP Antibody, Anti-Marina antibody, Sjogrens syndrome-A extractable nuclear antibody, Anti-DNA antibody, double-stranded, C3 and C4, Sjogrens syndrome-B extractable nuclear antibody  Positive ANA does not present specific clinical criteria main issue is chronic fatigue.  Exam is pretty benign.  Reported skin color changes some more suggestive for acral cyanosis versus Raynaud's syndrome.  We will check more specific antibody markers and serum complements.  If these are significantly abnormal would recommend scheduled follow-up to discuss treating for  suspected connective tissue disease.    Hypothyroidism, unspecified type  She has history of hypothyroidism but previously negative thyroid peroxidase antibodies probably not the cause for the positive ANA titer.  He is back on taking low-dose Synthroid for replacement.  Orders: Orders Placed This Encounter  Procedures   RNP Antibody   Anti-Germond antibody   Sjogrens syndrome-A extractable nuclear antibody   Anti-DNA antibody, double-stranded   C3 and C4   Sjogrens syndrome-B extractable nuclear antibody   No orders of the defined types were placed in this encounter.    Follow-Up Instructions: No follow-ups on file.   Fuller Plan, MD  Note - This record has been created using [employer].  Chart creation errors have been sought, but may not always  have been located. Such creation errors do not reflect on  the standard of medical care.

## 2022-04-13 ENCOUNTER — Ambulatory Visit: Payer: BC Managed Care – PPO | Admitting: Internal Medicine

## 2022-04-13 ENCOUNTER — Encounter: Payer: Self-pay | Admitting: Internal Medicine

## 2022-04-13 VITALS — BP 136/86 | HR 83 | Resp 16 | Ht 64.0 in | Wt 198.0 lb

## 2022-04-13 DIAGNOSIS — E039 Hypothyroidism, unspecified: Secondary | ICD-10-CM | POA: Diagnosis not present

## 2022-04-13 DIAGNOSIS — R768 Other specified abnormal immunological findings in serum: Secondary | ICD-10-CM | POA: Diagnosis not present

## 2022-04-13 DIAGNOSIS — R5383 Other fatigue: Secondary | ICD-10-CM

## 2022-04-14 ENCOUNTER — Other Ambulatory Visit: Payer: Self-pay | Admitting: Nurse Practitioner

## 2022-04-14 ENCOUNTER — Encounter: Payer: Self-pay | Admitting: Nurse Practitioner

## 2022-04-14 DIAGNOSIS — R109 Unspecified abdominal pain: Secondary | ICD-10-CM

## 2022-04-14 DIAGNOSIS — E039 Hypothyroidism, unspecified: Secondary | ICD-10-CM

## 2022-04-14 LAB — SJOGRENS SYNDROME-A EXTRACTABLE NUCLEAR ANTIBODY: SSA (Ro) (ENA) Antibody, IgG: <1 AI

## 2022-04-14 LAB — SJOGRENS SYNDROME-B EXTRACTABLE NUCLEAR ANTIBODY: SSB (La) (ENA) Antibody, IgG: <1 AI

## 2022-04-14 LAB — ANTI-DNA ANTIBODY, DOUBLE-STRANDED: ds DNA Ab: 1 IU/mL

## 2022-04-14 LAB — ANTI-SMITH ANTIBODY: ENA SM Ab Ser-aCnc: <1 AI

## 2022-04-14 LAB — C3 AND C4
C3 Complement: 155 mg/dL (ref 83–193)
C4 Complement: 35 mg/dL (ref 15–57)

## 2022-04-14 LAB — RNP ANTIBODY: Ribonucleic Protein(ENA) Antibody, IgG: <1 AI

## 2022-04-14 NOTE — Progress Notes (Signed)
Patient reports abdominal pain/cramping with intake of bread. Requesting screening for celiac disease.  ?

## 2022-04-14 NOTE — Telephone Encounter (Signed)
This encounter was created in error - please disregard.

## 2022-04-14 NOTE — Progress Notes (Signed)
error 

## 2022-05-02 DIAGNOSIS — E039 Hypothyroidism, unspecified: Secondary | ICD-10-CM | POA: Diagnosis not present

## 2022-05-03 LAB — TSH: TSH: 3 mIU/L

## 2022-05-03 LAB — TISSUE TRANSGLUTAMINASE, IGA: (tTG) Ab, IgA: <1 U/mL

## 2022-05-03 LAB — T4, FREE: Free T4: 1.1 ng/dL (ref 0.8–1.8)

## 2022-05-05 ENCOUNTER — Other Ambulatory Visit: Payer: Self-pay | Admitting: Nurse Practitioner

## 2022-05-05 DIAGNOSIS — E039 Hypothyroidism, unspecified: Secondary | ICD-10-CM

## 2022-05-05 MED ORDER — LEVOTHYROXINE SODIUM 25 MCG PO TABS: 25.0000 ug | ORAL_TABLET | Freq: Every day | ORAL | 2 refills | Status: DC

## 2022-05-25 ENCOUNTER — Encounter: Payer: Self-pay | Admitting: Nurse Practitioner

## 2022-05-27 ENCOUNTER — Encounter: Payer: Self-pay | Admitting: Nurse Practitioner

## 2022-05-27 ENCOUNTER — Ambulatory Visit: Payer: BC Managed Care – PPO | Admitting: Nurse Practitioner

## 2022-05-27 VITALS — BP 120/72 | HR 65 | Temp 98.1°F | Ht 64.0 in | Wt 195.6 lb

## 2022-05-27 DIAGNOSIS — J0110 Acute frontal sinusitis, unspecified: Secondary | ICD-10-CM | POA: Insufficient documentation

## 2022-05-27 MED ORDER — AMOXICILLIN-POT CLAVULANATE 875-125 MG PO TABS
1.0000 | ORAL_TABLET | Freq: Two times a day (BID) | ORAL | 0 refills | Status: DC
Start: 2022-05-27 — End: 2022-06-30

## 2022-05-27 NOTE — Assessment & Plan Note (Signed)
Acute, left side worse than right. Augmentin 875-125mg /BID x 10 days. Follow-up if symptoms worsen or do not improve.

## 2022-05-27 NOTE — Progress Notes (Signed)
   Established Patient Office Visit  Subjective   Patient ID: Nichole Zamora, female    DOB: January 29, 1976  Age: 46 y.o. MRN: [id number]  Chief Complaint  Patient presents with   Cough    Cough This is a new problem. The current episode started in the past 7 days. The problem has been gradually improving. The cough is Productive of sputum. Associated symptoms include ear congestion, a sore throat and shortness of breath. Pertinent negatives include no chest pain, chills, fever or rhinorrhea.      Review of Systems  Constitutional:  Positive for malaise/fatigue. Negative for chills and fever.  HENT:  Positive for sore throat. Negative for rhinorrhea.   Respiratory:  Positive for cough and shortness of breath.   Cardiovascular:  Negative for chest pain.  Gastrointestinal:  Negative for nausea.      Objective:     BP 120/72 (BP Location: Left Arm, Patient Position: Sitting, Cuff Size: Normal)   Pulse 65   Temp 98.1 F (36.7 C) (Temporal)   Ht 5\' 4"  (1.626 m)   Wt 195 lb 9.6 oz (88.7 kg)   SpO2 99%   BMI 33.57 kg/m  BP Readings from Last 3 Encounters:  05/27/22 120/72  04/13/22 136/86  02/03/22 118/72   Wt Readings from Last 3 Encounters:  05/27/22 195 lb 9.6 oz (88.7 kg)  04/13/22 198 lb (89.8 kg)  02/03/22 192 lb 12.8 oz (87.5 kg)      Physical Exam Vitals reviewed.  Constitutional:      General: She is not in acute distress.    Appearance: Normal appearance.  HENT:     Head: Normocephalic and atraumatic.     Right Ear: Hearing, tympanic membrane, ear canal and external ear normal.     Left Ear: Hearing, tympanic membrane, ear canal and external ear normal.  Neck:     Vascular: No carotid bruit.  Cardiovascular:     Rate and Rhythm: Normal rate and regular rhythm.     Pulses: Normal pulses.     Heart sounds: Normal heart sounds.  Pulmonary:     Effort: Pulmonary effort is normal.     Breath sounds: Normal breath sounds.  Lymphadenopathy:     Cervical:  No cervical adenopathy.  Skin:    General: Skin is warm and dry.  Neurological:     General: No focal deficit present.     Mental Status: She is alert and oriented to person, place, and time.  Psychiatric:        Mood and Affect: Mood normal.        Behavior: Behavior normal.        Judgment: Judgment normal.      No results found for any visits on 05/27/22.    The 10-year ASCVD risk score (Arnett DK, et al., 2019) is: 0.6%    Assessment & Plan:   Problem List Items Addressed This Visit       Respiratory   Acute non-recurrent frontal sinusitis - Primary    Acute, left side worse than right. Augmentin 875-125mg /BID x 10 days. Follow-up if symptoms worsen or do not improve.       Relevant Medications   amoxicillin-clavulanate (AUGMENTIN) 875-125 MG tablet    Return if symptoms worsen or fail to improve.    2020, NP

## 2022-06-02 ENCOUNTER — Encounter: Payer: Self-pay | Admitting: Nurse Practitioner

## 2022-06-02 ENCOUNTER — Other Ambulatory Visit: Payer: Self-pay | Admitting: Nurse Practitioner

## 2022-06-02 DIAGNOSIS — J0110 Acute frontal sinusitis, unspecified: Secondary | ICD-10-CM

## 2022-06-02 MED ORDER — BENZONATATE 100 MG PO CAPS: 200.0000 mg | ORAL_CAPSULE | Freq: Two times a day (BID) | ORAL | 0 refills | Status: DC | PRN

## 2022-06-03 ENCOUNTER — Encounter: Payer: Self-pay | Admitting: Nurse Practitioner

## 2022-06-03 NOTE — Telephone Encounter (Signed)
Spoke with the pharmacy and all they are asking for is the ok to use a different brand (Alvogen) for the synthroid.

## 2022-06-07 NOTE — Telephone Encounter (Signed)
I am not seeing a fax from the pharmacy but they did ask for another brand instead.

## 2022-06-08 DIAGNOSIS — Z6834 Body mass index (BMI) 34.0-34.9, adult: Secondary | ICD-10-CM | POA: Diagnosis not present

## 2022-06-08 DIAGNOSIS — Z124 Encounter for screening for malignant neoplasm of cervix: Secondary | ICD-10-CM | POA: Diagnosis not present

## 2022-06-08 DIAGNOSIS — R87619 Unspecified abnormal cytological findings in specimens from cervix uteri: Secondary | ICD-10-CM | POA: Insufficient documentation

## 2022-06-08 DIAGNOSIS — Z01419 Encounter for gynecological examination (general) (routine) without abnormal findings: Secondary | ICD-10-CM | POA: Diagnosis not present

## 2022-06-08 DIAGNOSIS — Z1151 Encounter for screening for human papillomavirus (HPV): Secondary | ICD-10-CM | POA: Diagnosis not present

## 2022-06-17 ENCOUNTER — Encounter: Payer: Self-pay | Admitting: Nurse Practitioner

## 2022-06-30 ENCOUNTER — Ambulatory Visit: Payer: BC Managed Care – PPO | Admitting: Nurse Practitioner

## 2022-06-30 VITALS — BP 122/72 | HR 72 | Temp 98.2°F | Ht 64.0 in | Wt 196.0 lb

## 2022-06-30 DIAGNOSIS — R21 Rash and other nonspecific skin eruption: Secondary | ICD-10-CM

## 2022-06-30 DIAGNOSIS — R519 Headache, unspecified: Secondary | ICD-10-CM | POA: Diagnosis not present

## 2022-06-30 DIAGNOSIS — E559 Vitamin D deficiency, unspecified: Secondary | ICD-10-CM | POA: Diagnosis not present

## 2022-06-30 DIAGNOSIS — E039 Hypothyroidism, unspecified: Secondary | ICD-10-CM

## 2022-06-30 LAB — COMPREHENSIVE METABOLIC PANEL
ALT: 13 U/L (ref 0–35)
AST: 21 U/L (ref 0–37)
Albumin: 4.5 g/dL (ref 3.5–5.2)
Alkaline Phosphatase: 51 U/L (ref 39–117)
BUN: 10 mg/dL (ref 6–23)
CO2: 29 mEq/L (ref 19–32)
Calcium: 9.6 mg/dL (ref 8.4–10.5)
Chloride: 99 mEq/L (ref 96–112)
Creatinine, Ser: 0.84 mg/dL (ref 0.40–1.20)
GFR: 84.25 mL/min (ref >60.00)
Glucose, Bld: 93 mg/dL (ref 70–99)
Potassium: 3.6 mEq/L (ref 3.5–5.1)
Sodium: 138 mEq/L (ref 135–145)
Total Bilirubin: 1.3 mg/dL — ABNORMAL HIGH (ref 0.2–1.2)
Total Protein: 7.5 g/dL (ref 6.0–8.3)

## 2022-06-30 LAB — CBC
HCT: 41.4 % (ref 36.0–46.0)
Hemoglobin: 13.7 g/dL (ref 12.0–15.0)
MCHC: 33.2 g/dL (ref 30.0–36.0)
MCV: 83.7 fl (ref 78.0–100.0)
Platelets: 270 10*3/uL (ref 150.0–400.0)
RBC: 4.94 Mil/uL (ref 3.87–5.11)
RDW: 14.3 % (ref 11.5–15.5)
WBC: 9 10*3/uL (ref 4.0–10.5)

## 2022-06-30 LAB — HEMOGLOBIN A1C: Hgb A1c MFr Bld: 5.7 % (ref 4.6–6.5)

## 2022-06-30 LAB — TSH: TSH: 3.66 u[IU]/mL (ref 0.35–5.50)

## 2022-06-30 LAB — VITAMIN D 25 HYDROXY (VIT D DEFICIENCY, FRACTURES): VITD: 69.05 ng/mL (ref 30.00–100.00)

## 2022-06-30 MED ORDER — DOXYCYCLINE HYCLATE 100 MG PO TABS: 100.0000 mg | ORAL_TABLET | Freq: Two times a day (BID) | ORAL | 0 refills | Status: DC

## 2022-06-30 MED ORDER — CLOTRIMAZOLE-BETAMETHASONE 1-0.05 % EX CREA: 1.0000 | TOPICAL_CREAM | Freq: Every day | CUTANEOUS | 0 refills | Status: DC

## 2022-06-30 NOTE — Assessment & Plan Note (Signed)
Chronic, check TSH today.  Further recommendations may be made based upon his results.  Patient told that if TSH is abnormal she will be encouraged to follow-up with holistic doctor to discuss dosage change of her natural thyroid supplement.  She reports her understanding.

## 2022-06-30 NOTE — Assessment & Plan Note (Signed)
We will check serum level as well as calcium level today for further evaluation, further recommendations may be made based upon these results.

## 2022-06-30 NOTE — Assessment & Plan Note (Addendum)
Etiology unclear appears consisted with ringworm vs. Bullseye rash of lyme disease.  Denies any tick bite but upon further questioning does remember seeing a tick on item of clothing recently.  We will first treat with Lotrisone cream that she can apply daily, if no improvement in rash recommend she take doxycycline for 7 days.  Patient reports understanding.  She is encouraged let me know if symptoms persist or worsen at which point may also consider referral to dermatology.

## 2022-06-30 NOTE — Progress Notes (Signed)
Established Patient Office Visit  Subjective   Patient ID: Nichole Zamora, female    DOB: [birth year]  Age: 46 y.o. MRN: [id number]  Chief Complaint  Patient presents with   17mo follow up    Possible bite mark on leg, discuss thyroid meds and form completion    Patient arrives today for the above.  She has a red, mostly flat, very round with obvious borders rash to the right inner thigh.  This has been present for over a week.  She has applied hydrocortisone cream over-the-counter.  She denies any known tick bite she also denies any worsening of her chronic arthralgias or fever.  She tells me the rash does not itch.  It has been stable in size.  She was notified by her pharmacy that her levothyroxine manufacturer had changed.  When she started taking the new manufactured medication she started experiencing headaches.  She went and discussed this with her pharmacy and per their records her levothyroxine never did actually change to a different manufacturer.  She tells me she felt so unwell on the medication that she actually decided to go back to a more natural thyroid product which she is given by her holistic provider.  She reports that her holistic provider monitors this and checks her TSH yearly.  She seems to be tolerating this medication fairly well except she still has a generalized mild headache.  She tells me its not bad enough that she takes anything to treat it but it is present in somewhat bothersome.  She denies any visual changes or aura, also denies nausea or vomiting.      Review of Systems  Constitutional:  Negative for fever.  Eyes:  Positive for photophobia. Negative for blurred vision and double vision.  Gastrointestinal:  Negative for nausea and vomiting.  Musculoskeletal:  Positive for joint pain (chronic, stable).  Neurological:  Positive for headaches.      Objective:     BP 122/72 (BP Location: Left Arm, Patient Position: Sitting, Cuff Size: Large)   Pulse  72   Temp 98.2 F (36.8 C) (Oral)   Ht 5\' 4"  (1.626 m)   Wt 196 lb (88.9 kg)   SpO2 96%   BMI 33.64 kg/m       02/03/2022    2:33 PM 03/09/2020    2:08 PM  PHQ9 SCORE ONLY  PHQ-9 Total Score 0 0     Physical Exam Vitals reviewed.  Constitutional:      General: She is not in acute distress.    Appearance: Normal appearance.  HENT:     Head: Normocephalic and atraumatic.  Neck:     Vascular: No carotid bruit.  Cardiovascular:     Rate and Rhythm: Normal rate and regular rhythm.     Pulses: Normal pulses.     Heart sounds: Normal heart sounds.  Pulmonary:     Effort: Pulmonary effort is normal.     Breath sounds: Normal breath sounds.  Skin:    General: Skin is warm and dry.          Comments: Round, flat with possible crusted edges, no clearing noted in the middle.  Surrounding skin intact and of normal complexion.  Neurological:     General: No focal deficit present.     Mental Status: She is alert and oriented to person, place, and time.  Psychiatric:        Mood and Affect: Mood normal.  Behavior: Behavior normal.        Judgment: Judgment normal.      No results found for any visits on 06/30/22.    The 10-year ASCVD risk score (Arnett DK, et al., 2019) is: 0.6%    Assessment & Plan:   Problem List Items Addressed This Visit       Endocrine   Hypothyroidism    Chronic, check TSH today.  Further recommendations may be made based upon his results.  Patient told that if TSH is abnormal she will be encouraged to follow-up with holistic doctor to discuss dosage change of her natural thyroid supplement.  She reports her understanding.        Musculoskeletal and Integument   Rash - Primary    Etiology unclear appears consisted with ringworm vs. Bullseye rash of lyme disease.  Denies any tick bite but upon further questioning does remember seeing a tick on item of clothing recently.  We will first treat with Lotrisone cream that she can apply daily,  if no improvement in rash recommend she take doxycycline for 7 days.  Patient reports understanding.  She is encouraged let me know if symptoms persist or worsen at which point may also consider referral to dermatology.      Relevant Medications   doxycycline (VIBRA-TABS) 100 MG tablet   clotrimazole-betamethasone (LOTRISONE) cream   Other Relevant Orders   CBC   Comprehensive metabolic panel   TSH   Hemoglobin A1c     Other   Intractable headache    Mild, blood work ordered today for further evaluation.  Further recommendations may be made upon these results.      Relevant Orders   CBC   Comprehensive metabolic panel   TSH   Hemoglobin A1c   Vitamin D deficiency    We will check serum level as well as calcium level today for further evaluation, further recommendations may be made based upon these results.      Relevant Orders   Comprehensive metabolic panel   VITAMIN D 25 Hydroxy (Vit-D Deficiency, Fractures)    Return in about 3 months (around 09/30/2022) for 3-6 month f/u with Maralyn Sago.    [name], NP

## 2022-06-30 NOTE — Assessment & Plan Note (Signed)
Mild, blood work ordered today for further evaluation.  Further recommendations may be made upon these results.

## 2022-07-01 ENCOUNTER — Encounter: Payer: Self-pay | Admitting: Nurse Practitioner

## 2022-07-05 ENCOUNTER — Encounter: Payer: Self-pay | Admitting: Nurse Practitioner

## 2022-07-08 ENCOUNTER — Other Ambulatory Visit: Payer: Self-pay | Admitting: Nurse Practitioner

## 2022-07-08 DIAGNOSIS — R17 Unspecified jaundice: Secondary | ICD-10-CM

## 2022-07-08 DIAGNOSIS — R109 Unspecified abdominal pain: Secondary | ICD-10-CM

## 2022-08-04 ENCOUNTER — Ambulatory Visit: Payer: BC Managed Care – PPO | Admitting: Nurse Practitioner

## 2022-08-10 ENCOUNTER — Encounter: Payer: Self-pay | Admitting: Physician Assistant

## 2022-08-10 ENCOUNTER — Other Ambulatory Visit (INDEPENDENT_AMBULATORY_CARE_PROVIDER_SITE_OTHER): Payer: BC Managed Care – PPO

## 2022-08-10 ENCOUNTER — Ambulatory Visit: Payer: BC Managed Care – PPO | Admitting: Physician Assistant

## 2022-08-10 VITALS — BP 104/60 | HR 64 | Ht 64.0 in | Wt 193.0 lb

## 2022-08-10 DIAGNOSIS — R1013 Epigastric pain: Secondary | ICD-10-CM | POA: Diagnosis not present

## 2022-08-10 DIAGNOSIS — R12 Heartburn: Secondary | ICD-10-CM | POA: Diagnosis not present

## 2022-08-10 DIAGNOSIS — R1030 Lower abdominal pain, unspecified: Secondary | ICD-10-CM

## 2022-08-10 DIAGNOSIS — E039 Hypothyroidism, unspecified: Secondary | ICD-10-CM

## 2022-08-10 DIAGNOSIS — I10 Essential (primary) hypertension: Secondary | ICD-10-CM

## 2022-08-10 DIAGNOSIS — K5909 Other constipation: Secondary | ICD-10-CM

## 2022-08-10 DIAGNOSIS — Z713 Dietary counseling and surveillance: Secondary | ICD-10-CM | POA: Diagnosis not present

## 2022-08-10 DIAGNOSIS — R1010 Upper abdominal pain, unspecified: Secondary | ICD-10-CM | POA: Diagnosis not present

## 2022-08-10 LAB — CBC WITH DIFFERENTIAL/PLATELET
Basophils Absolute: 0.1 10*3/uL (ref 0.0–0.1)
Basophils Relative: 1.1 % (ref 0.0–3.0)
Eosinophils Absolute: 0.2 10*3/uL (ref 0.0–0.7)
Eosinophils Relative: 3 % (ref 0.0–5.0)
HCT: 43.1 % (ref 36.0–46.0)
Hemoglobin: 14.2 g/dL (ref 12.0–15.0)
Lymphocytes Relative: 28.6 % (ref 12.0–46.0)
Lymphs Abs: 2.1 10*3/uL (ref 0.7–4.0)
MCHC: 33.1 g/dL (ref 30.0–36.0)
MCV: 82.6 fl (ref 78.0–100.0)
Monocytes Absolute: 0.4 10*3/uL (ref 0.1–1.0)
Monocytes Relative: 5.2 % (ref 3.0–12.0)
Neutro Abs: 4.6 10*3/uL (ref 1.4–7.7)
Neutrophils Relative %: 62.1 % (ref 43.0–77.0)
Platelets: 252 10*3/uL (ref 150.0–400.0)
RBC: 5.21 Mil/uL — ABNORMAL HIGH (ref 3.87–5.11)
RDW: 14.3 % (ref 11.5–15.5)
WBC: 7.3 10*3/uL (ref 4.0–10.5)

## 2022-08-10 LAB — SEDIMENTATION RATE: Sed Rate: 33 mm/hr — ABNORMAL HIGH (ref 0–20)

## 2022-08-10 LAB — BASIC METABOLIC PANEL
BUN: 10 mg/dL (ref 6–23)
CO2: 27 mEq/L (ref 19–32)
Calcium: 9.8 mg/dL (ref 8.4–10.5)
Chloride: 102 mEq/L (ref 96–112)
Creatinine, Ser: 0.82 mg/dL (ref 0.40–1.20)
GFR: 86.65 mL/min (ref >60.00)
Glucose, Bld: 91 mg/dL (ref 70–99)
Potassium: 3.1 mEq/L — ABNORMAL LOW (ref 3.5–5.1)
Sodium: 140 mEq/L (ref 135–145)

## 2022-08-10 LAB — C-REACTIVE PROTEIN: CRP: 1.2 mg/dL (ref 0.5–20.0)

## 2022-08-10 NOTE — Patient Instructions (Signed)
If you are age 45 or younger, your body mass index should be between 19-25. Your Body mass index is 33.13 kg/m. If this is out of the aformentioned range listed, please consider follow up with your Primary Care Provider.  ________________________________________________________  The [facility] providers would like to encourage you to use [facility] to communicate with providers for non-urgent requests or questions.  Due to long hold times on the telephone, sending your provider a message by [facility] may be a faster and more efficient way to get a response.  Please allow 48 business hours for a response.  Please remember that this is for non-urgent requests.  _______________________________________________________  Nichole Zamora have been scheduled for a CT scan of the abdomen and pelvis at [facility], 1st floor Radiology. You are scheduled on 08/22/2022 at 2:00 pm. You should arrive 15 minutes prior to your appointment time for registration.  We are giving you 2 bottles of contrast today that you will need to drink before arriving for the exam. The solution may taste better if refrigerated so put them in the refrigerator when you get home, but do NOT add ice or any other liquid to this solution as that would dilute it. Shake well before drinking.   Please follow the written instructions below on the day of your exam:   1) Do not eat anything after 10:00 am (4 hours prior to your test)   2) Drink 1 bottle of contrast @ 12:00 pm (2 hours prior to your exam)  Remember to shake well before drinking and do NOT pour over ice.     Drink 1 bottle of contrast @ 1:00 pm (1 hour prior to your exam)   You may take any medications as prescribed with a small amount of water, if necessary. If you take any of the following medications: METFORMIN, GLUCOPHAGE, GLUCOVANCE, AVANDAMET, RIOMET, FORTAMET, [place] MET, JANUMET, GLUMETZA or METAGLIP, you MAY be asked to HOLD this medication 48 hours AFTER the exam.   The  purpose of you drinking the oral contrast is to aid in the visualization of your intestinal tract. The contrast solution may cause some diarrhea. Depending on your individual set of symptoms, you may also receive an intravenous injection of x-ray contrast/dye. Plan on being at [facility] for 45 minutes or longer, depending on the type of exam you are having performed.   If you have any questions regarding your exam or if you need to reschedule, you may call [facility] at [phone] between the hours of 8:00 am and 5:00 pm, Monday-Friday.   Your provider has requested that you go to the basement level for lab work before leaving today. Press "B" on the elevator. The lab is located at the first door on the left as you exit the elevator.  Continue Famotidine 20 mg 1 tablet every morning before breakfast.  Follow up pending the results of your CT  Thank you for entrusting me with your care and choosing [facility].  Amy Esterwood, PA-C

## 2022-08-10 NOTE — Progress Notes (Signed)
Subjective:    Patient ID: Nichole Zamora, female    DOB: 1976/03/25, 46 y.o.   MRN: [id number]  HPI Nichole Zamora is a 46 year old white female, new to GI today referred by Jeralyn Ruths, NP for evaluation of abdominal pain.  Patient has not had any prior GI evaluation. She says that she has been having sharp pains intermittently that are occurring on a daily basis bilaterally in her lower abdomen over the past 8 to 9 months.  The symptoms have not necessarily progressed but have persisted.  She says the pain is not disabling but it is something new that she has never experienced before. She does have chronic issues with constipation and uses an herbal supplement daily for the constipation which she says works well.  If she does not take this supplement she will go for as long as a week between bowel movements.  She has not had any change in that pattern over the past several months.  No melena or hematochezia.  He does not necessarily feel better after a bowel movement. She also has had issues in the past which have also been bothering her again over the past several weeks with a sense of fullness or pressure in her upper abdomen.  She says she has seen a holistic practitioner for this in the past and was told that her stomach may be protruding into her diaphragm and she was referred to a massage therapist who worked with her on this issue.  She had found that helpful.  More recently she has been experiencing that feeling again, has some complaints of upper abdominal discomfort and also has been having some early morning gnawing discomfort.  She says if she gets up eats a little something and takes Pepcid 20 mg this will improve her symptoms.  She has also been having some heartburn and reflux symptoms recently as well. No dysphagia or odynophagia.  She mentioned that she did have a barium swallow last year, February 2022-showed normal esophageal distention, no mass or stricture, barium tablet passed without  difficulty normal-appearing motility and no hiatal hernia.  Other medical problems include hypertension, and hypothyroidism.  No prior abdominal surgery  No family history of colon cancers polyps or IBD  Review of Systems Pertinent positive and negative review of systems were noted in the above HPI section.  All other review of systems was otherwise negative.   Outpatient Encounter Medications as of 08/10/2022  Medication Sig   Bacillus Coagulans-Inulin (PROBIOTIC) 1-250 BILLION-MG CAPS Take 1 capsule by mouth daily.   Cholecalciferol (VITAMIN D-3) 125 MCG (5000 UT) TABS Take 1 tablet by mouth daily.    clotrimazole-betamethasone (LOTRISONE) cream Apply 1 Application topically daily.   Cobalamin Combinations (VITAMIN B12-FOLIC ACID PO) Take 1 tablet by mouth daily.   famotidine (PEPCID) 40 MG tablet Take 40 mg by mouth daily.   hydrochlorothiazide (MICROZIDE) 12.5 MG capsule Take 1 capsule by mouth once daily   Magnesium 500 MG CAPS Take 1 capsule by mouth daily.   progesterone (PROMETRIUM) 200 MG capsule Take 1 capsule (200 mg total) by mouth daily. (Patient taking differently: Take 200 mg by mouth daily as needed.)   UNABLE TO FIND Take 2 capsules by mouth daily. Med Name: Thyroxal   BETAINE PO Take by mouth.   OVER THE COUNTER MEDICATION Adapticin 1 tablet daily (Patient not taking: Reported on 08/10/2022)   [DISCONTINUED] doxycycline (VIBRA-TABS) 100 MG tablet Take 1 tablet (100 mg total) by mouth 2 (two) times daily.   [  DISCONTINUED] levocetirizine (XYZAL) 5 MG tablet Take 1 tablet (5 mg total) by mouth every evening.   [DISCONTINUED] NON FORMULARY Take 1 tablet by mouth in the morning and at bedtime. 1 tablet in the morning and 1 at lunch time   [DISCONTINUED] UNABLE TO FIND Take 1 capsule by mouth daily. Med Name: Glutithione   No facility-administered encounter medications on file as of 08/10/2022.   Allergies  Allergen Reactions   Codeine Other (See Comments)    Dizziness, Light  headed Unknown.    Patient Active Problem List   Diagnosis Date Noted   Vitamin D deficiency 06/30/2022   Rash 06/30/2022   Acute non-recurrent frontal sinusitis 05/27/2022   Positive ANA (antinuclear antibody) 04/13/2022   Other fatigue 04/13/2022   Hypothyroidism 01/20/2020   Essential hypertension, benign 11/12/2019   PMS (premenstrual syndrome) 11/12/2019   Obesity (BMI 30.0-34.9) 11/12/2019   Intractable headache 02/09/2016   Social History   Socioeconomic History   Marital status: Divorced    Spouse name: Not on file   Number of children: Not on file   Years of education: Not on file   Highest education level: Not on file  Occupational History   Not on file  Tobacco Use   Smoking status: Never   Smokeless tobacco: Never  Vaping Use   Vaping Use: Never used  Substance and Sexual Activity   Alcohol use: Not Currently   Drug use: Never   Sexual activity: Yes  Other Topics Concern   Not on file  Social History Narrative   Divorced 19 years.Lives with her son.Nichole Zamora.   Social Determinants of Health   Financial Resource Strain: Not on file  Food Insecurity: Not on file  Transportation Needs: Not on file  Physical Activity: Not on file  Stress: Not on file  Social Connections: Not on file  Intimate Partner Violence: Not on file    Nichole Zamora's family history includes Diabetes in her brother, father, and mother.      Objective:    Vitals:   08/10/22 1458  BP: 104/60  Pulse: 64    Physical Exam Well-developed well-nourished WF  in no acute distress.  Height, VELFYB,017 BMI 33.1  HEENT; nontraumatic normocephalic, EOMI, PE R LA, sclera anicteric. Oropharynx; not examined today Neck; supple, no JVD Cardiovascular; regular rate and rhythm with S1-S2, no murmur rub or gallop Pulmonary; Clear bilaterally Abdomen; soft,  nondistended, there is tenderness bilaterally in the lower quadrants, no guarding, she is also tender in the  hypogastrium, with mild fullness, no palpable mass or hepatosplenomegaly, bowel sounds are active Rectal; not examined today Skin; benign exam, no jaundice rash or appreciable lesions Extremities; no clubbing cyanosis or edema skin warm and dry Neuro/Psych; alert and oriented x4, grossly nonfocal mood and affect appropriate        Assessment & Plan:   #51 46 year old female with 8 to 26-monthhistory of bilateral lower quadrant abdominal pain, sharp in nature intermittent and persistent. This is in the setting of chronic long-term constipation.  Etiology is not clear, rule out intra-abdominal/pelvic inflammatory process, IBD, IBS, gynecologic issue.  #2 upper abdominal/epigastric discomfort, intermittent heartburn  Normal barium swallow February 2022  Consider chronic gastropathy, rule out H. pylori,  #3 hypothyroidism #4.  Hypertension  Plan; CBC with differential, be met, sed rate, CRP, H. pylori antibody Continue Pepcid 20 mg p.o. every morning AC breakfast For now can continue OTC herbal supplement for constipation management Patient will be scheduled for CT  scan of the abdomen pelvis with contrast  Recommendations pending results of above.  Patient will be established with Dr. Tarri Glenn.  He may potentially need endoscopic evaluation if CT unrevealing.    Namiyah Grantham Genia Harold PA-C 08/10/2022   Cc: Ailene Ards, NP

## 2022-08-11 LAB — H. PYLORI ANTIBODY, IGG: H Pylori IgG: NEGATIVE

## 2022-08-12 NOTE — Telephone Encounter (Signed)
Nichole Zamora see message from the pt. Nichole Zamora is off today.

## 2022-08-16 ENCOUNTER — Other Ambulatory Visit: Payer: Self-pay

## 2022-08-16 MED ORDER — POTASSIUM CHLORIDE CRYS ER 20 MEQ PO TBCR: 20.0000 meq | EXTENDED_RELEASE_TABLET | Freq: Two times a day (BID) | ORAL | 0 refills | Status: DC

## 2022-08-18 ENCOUNTER — Encounter: Payer: Self-pay | Admitting: Nurse Practitioner

## 2022-08-22 ENCOUNTER — Other Ambulatory Visit (HOSPITAL_COMMUNITY): Payer: BC Managed Care – PPO

## 2022-08-25 DIAGNOSIS — Z713 Dietary counseling and surveillance: Secondary | ICD-10-CM | POA: Diagnosis not present

## 2022-09-09 ENCOUNTER — Encounter: Payer: Self-pay | Admitting: Nurse Practitioner

## 2022-09-09 ENCOUNTER — Telehealth (INDEPENDENT_AMBULATORY_CARE_PROVIDER_SITE_OTHER): Payer: BC Managed Care – PPO | Admitting: Nurse Practitioner

## 2022-09-09 DIAGNOSIS — U071 COVID-19: Secondary | ICD-10-CM | POA: Diagnosis not present

## 2022-09-09 MED ORDER — NIRMATRELVIR/RITONAVIR (PAXLOVID)TABLET: 3.0000 | ORAL_TABLET | Freq: Two times a day (BID) | ORAL | 0 refills | Status: AC

## 2022-09-09 NOTE — Assessment & Plan Note (Signed)
Acute, symptoms are mild.  Patient is borderline and increased risk for severe disease due to her history of obesity and possibly hypothyroidism.  Additionally question of all history for hypertension.  Patient reports she is never had hypertension and only takes hydrochlorothiazide for swelling, hypertension has been on her problem list in the past but as stated above patient reports this is a discrepancy.  Therefore I think it is reasonable to prescribe antiviral medication to prevent severe illness.  Per patient preference we will prescribe Paxlovid, recommend patient does not take her usual supplements as were not sure if these may interact with Paxlovid until she has completed the medication.  Patient educated on current isolation quarantine guidelines.

## 2022-09-09 NOTE — Progress Notes (Signed)
   Established Patient Office Visit   An audio/visual tele-health visit was completed today for this patient. I connected with  Nichole Zamora on 09/09/22 utilizing audio/visual technology and verified that I am speaking with the correct person using two identifiers. The patient was located at their home, and I was located at the office of [place] at [facility] during the encounter. I discussed the limitations of evaluation and management by telemedicine. The patient expressed understanding and agreed to proceed.   Subjective   Patient ID: Nichole Zamora, female    DOB: 04-Mar-1976  Age: 46 y.o. MRN: [id number]  Chief Complaint  Patient presents with   Covid Positive    Patient took at home COVID-19 test today which appeared to be positive.  She reports symptoms started approximately 3 days ago.  She was exposed via her son, mom, and dad who have also tested positive.  Symptoms have been mild mainly scratchy throat and nonproductive cough.  No chest pain or shortness of breath.  No chills or fevers either.  She has taken Mucinex sinus for symptom management.    Review of Systems  Constitutional:  Negative for fever.  HENT:  Positive for sore throat. Negative for congestion.   Respiratory:  Positive for cough. Negative for sputum production and shortness of breath.   Cardiovascular:  Negative for chest pain.  Gastrointestinal:  Negative for abdominal pain, diarrhea and vomiting.  Musculoskeletal:  Negative for myalgias.  Neurological:  Negative for dizziness and headaches.      Objective:     There were no vitals taken for this visit. BP Readings from Last 3 Encounters:  08/10/22 104/60  06/30/22 122/72  05/27/22 120/72   Wt Readings from Last 3 Encounters:  08/10/22 193 lb (87.5 kg)  06/30/22 196 lb (88.9 kg)  05/27/22 195 lb 9.6 oz (88.7 kg)      Physical Exam Comprehensive physical exam not completed today as office visit was conducted remotely.   Patient appeared well over the video, she was not in any acute respiratory distress.  Patient was alert and oriented, and appeared to have appropriate judgment.   No results found for any visits on 09/09/22.    The 10-year ASCVD risk score (Arnett DK, et al., 2019) is: 0.5%    Assessment & Plan:   Problem List Items Addressed This Visit       Other   COVID-19 - Primary    Acute, symptoms are mild.  Patient is borderline and increased risk for severe disease due to her history of obesity and possibly hypothyroidism.  Additionally question of all history for hypertension.  Patient reports she is never had hypertension and only takes hydrochlorothiazide for swelling, hypertension has been on her problem list in the past but as stated above patient reports this is a discrepancy.  Therefore I think it is reasonable to prescribe antiviral medication to prevent severe illness.  Per patient preference we will prescribe Paxlovid, recommend patient does not take her usual supplements as were not sure if these may interact with Paxlovid until she has completed the medication.  Patient educated on current isolation quarantine guidelines.        Relevant Medications   nirmatrelvir/ritonavir EUA (PAXLOVID) 20 x 150 MG & 10 x 100MG  TABS    Return if symptoms worsen or fail to improve.    Ailene Ards, NP

## 2022-09-12 DIAGNOSIS — Z713 Dietary counseling and surveillance: Secondary | ICD-10-CM | POA: Diagnosis not present

## 2022-09-26 ENCOUNTER — Ambulatory Visit: Payer: BC Managed Care – PPO | Admitting: Physician Assistant

## 2022-10-10 DIAGNOSIS — Z713 Dietary counseling and surveillance: Secondary | ICD-10-CM | POA: Diagnosis not present

## 2022-10-12 ENCOUNTER — Ambulatory Visit: Payer: BC Managed Care – PPO | Admitting: Physician Assistant

## 2022-10-31 DIAGNOSIS — Z713 Dietary counseling and surveillance: Secondary | ICD-10-CM | POA: Diagnosis not present

## 2022-11-10 ENCOUNTER — Ambulatory Visit: Payer: BC Managed Care – PPO | Admitting: Nurse Practitioner

## 2022-11-10 VITALS — BP 118/78 | HR 75 | Temp 97.9°F | Ht 64.0 in | Wt 194.1 lb

## 2022-11-10 DIAGNOSIS — E039 Hypothyroidism, unspecified: Secondary | ICD-10-CM

## 2022-11-10 DIAGNOSIS — K219 Gastro-esophageal reflux disease without esophagitis: Secondary | ICD-10-CM

## 2022-11-10 DIAGNOSIS — E669 Obesity, unspecified: Secondary | ICD-10-CM

## 2022-11-10 DIAGNOSIS — E876 Hypokalemia: Secondary | ICD-10-CM | POA: Diagnosis not present

## 2022-11-10 LAB — TSH: TSH: 3.35 u[IU]/mL (ref 0.35–5.50)

## 2022-11-10 LAB — BASIC METABOLIC PANEL
BUN: 13 mg/dL (ref 6–23)
CO2: 30 mEq/L (ref 19–32)
Calcium: 9.5 mg/dL (ref 8.4–10.5)
Chloride: 101 mEq/L (ref 96–112)
Creatinine, Ser: 0.91 mg/dL (ref 0.40–1.20)
GFR: 76.34 mL/min (ref >60.00)
Glucose, Bld: 92 mg/dL (ref 70–99)
Potassium: 3.6 mEq/L (ref 3.5–5.1)
Sodium: 139 mEq/L (ref 135–145)

## 2022-11-10 LAB — T3, FREE: T3, Free: 3.6 pg/mL (ref 2.3–4.2)

## 2022-11-10 LAB — T4, FREE: Free T4: 0.68 ng/dL (ref 0.60–1.60)

## 2022-11-10 MED ORDER — FAMOTIDINE 40 MG PO TABS: 40.0000 mg | ORAL_TABLET | Freq: Every day | ORAL | 1 refills | Status: DC

## 2022-11-10 NOTE — Progress Notes (Signed)
Established Patient Office Visit  Subjective   Patient ID: Nichole Zamora, female    DOB: Feb 12, 1976  Age: 46 y.o. MRN: [id number]  Chief Complaint  Patient presents with   Obesity   Hypothyroidism: Not currently on thyroid replacement medication.  She would like to have her selenium, zinc, and iodine levels checked.  Last TSH within normal limits collected approximately 4 months ago.  Hypokalemia: Incidental finding on labs collected about 4 months ago, patient is taking her own over-the-counter potassium supplement.  Did have this rechecked.  GERD: Controlled well with use of famotidine.  Requesting refill today.  Obesity: Would like to discuss weight loss options.  Elevated bilirubin: Reports having had been diagnosed with Delane Ginger Bears syndrome in the past.  No jaundice noted in recent past, no abdominal pain.     Review of Systems  Respiratory:  Negative for shortness of breath.   Cardiovascular:  Negative for chest pain.      Objective:     BP 118/78   Pulse 75   Temp 97.9 F (36.6 C) (Oral)   Ht 5\' 4"  (1.626 m)   Wt 194 lb 2 oz (88.1 kg)   LMP 10/17/2022   SpO2 98%   BMI 33.32 kg/m    Physical Exam Vitals reviewed.  Constitutional:      General: She is not in acute distress.    Appearance: Normal appearance.  HENT:     Head: Normocephalic and atraumatic.  Neck:     Vascular: No carotid bruit.  Cardiovascular:     Rate and Rhythm: Normal rate and regular rhythm.     Pulses: Normal pulses.     Heart sounds: Normal heart sounds.  Pulmonary:     Effort: Pulmonary effort is normal.     Breath sounds: Normal breath sounds.  Skin:    General: Skin is warm and dry.  Neurological:     General: No focal deficit present.     Mental Status: She is alert and oriented to person, place, and time.  Psychiatric:        Mood and Affect: Mood normal.        Behavior: Behavior normal.        Judgment: Judgment normal.      No results found for any visits on  11/10/22.    The 10-year ASCVD risk score (Arnett DK, et al., 2019) is: 0.6%    Assessment & Plan:   Problem List Items Addressed This Visit       Digestive   Gastroesophageal reflux disease    Chronic, continue famotidine 40 mg mouth daily.  Recommend reducing to 20 mg by mouth daily to see if this will still control symptoms if so she will continue at 20 mg.  Otherwise continue 40 mg daily.      Relevant Medications   famotidine (PEPCID) 40 MG tablet     Endocrine   Hypothyroidism - Primary    Chronic, check thyroid panel today.  Will also check zinc, selenium, and iodine levels.  Further recommendation made based on these results.      Relevant Orders   TSH   Zinc   Selenium serum   T3, free   T4, free   Iodine, Serum/Plasma     Other   Obesity (BMI 30.0-34.9)    Recommend lifestyle modification as she has been doing.  Patient will look into her insurance to see if any weight loss medication is covered.  If so we will  consider initiating pharmacological therapy to assist with weight loss.      Hypokalemia    Will check metabolic panel, if still hypokalemic recommend initiating potassium supplementation.      Relevant Orders   Basic metabolic panel   Gilbert's syndrome    Chronic, no jaundice noted on exam today.  No further workup recommended at this point.       Return in about 3 months (around 02/09/2023) for f/u with Maralyn Sago.    [name], NP

## 2022-11-10 NOTE — Assessment & Plan Note (Signed)
Recommend lifestyle modification as she has been doing.  Patient will look into her insurance to see if any weight loss medication is covered.  If so we will consider initiating pharmacological therapy to assist with weight loss.

## 2022-11-10 NOTE — Assessment & Plan Note (Signed)
Chronic, check thyroid panel today.  Will also check zinc, selenium, and iodine levels.  Further recommendation made based on these results.

## 2022-11-10 NOTE — Patient Instructions (Addendum)
Discuss colonoscopy for colon cancer screening with Dr. Monica Becton when you see her in January.  Semaglutide (wegovy) Liraglutide (saxenda) Tirzepatide (ZepBound)

## 2022-11-10 NOTE — Assessment & Plan Note (Signed)
Chronic, no jaundice noted on exam today.  No further workup recommended at this point.

## 2022-11-10 NOTE — Assessment & Plan Note (Signed)
Chronic, continue famotidine 40 mg mouth daily.  Recommend reducing to 20 mg by mouth daily to see if this will still control symptoms if so she will continue at 20 mg.  Otherwise continue 40 mg daily.

## 2022-11-10 NOTE — Assessment & Plan Note (Signed)
Will check metabolic panel, if still hypokalemic recommend initiating potassium supplementation.

## 2022-11-15 LAB — SELENIUM SERUM: Selenium, Blood: 253 mcg/L — ABNORMAL HIGH (ref 63–160)

## 2022-11-15 LAB — IODINE, SERUM/PLASMA: Iodine: 55 mcg/L (ref 52–109)

## 2022-11-15 LAB — ZINC: Zinc: 70 ug/dL (ref 60–130)

## 2022-11-16 ENCOUNTER — Encounter: Payer: Self-pay | Admitting: Nurse Practitioner

## 2022-11-18 ENCOUNTER — Telehealth: Payer: Self-pay | Admitting: Nurse Practitioner

## 2022-11-18 ENCOUNTER — Encounter: Payer: Self-pay | Admitting: Nurse Practitioner

## 2022-11-18 NOTE — Telephone Encounter (Signed)
Nichole Zamora, I don't see a tele visit scheduled for f/u is she okay with waiting until February to discuss further?

## 2022-11-18 NOTE — Telephone Encounter (Signed)
Please call patient and schedule her for a tele-visit with me to discuss weight loss management.

## 2022-11-18 NOTE — Telephone Encounter (Signed)
Made pt aware of a follow up visit with provider in regards to weight loss management

## 2022-11-21 NOTE — Telephone Encounter (Signed)
Pt scheduled for tomorrow, 12/12

## 2022-11-22 ENCOUNTER — Telehealth: Payer: Self-pay | Admitting: Nurse Practitioner

## 2022-11-22 ENCOUNTER — Telehealth (INDEPENDENT_AMBULATORY_CARE_PROVIDER_SITE_OTHER): Payer: BC Managed Care – PPO | Admitting: Nurse Practitioner

## 2022-11-22 VITALS — Ht 63.0 in | Wt 194.7 lb

## 2022-11-22 DIAGNOSIS — Z6834 Body mass index (BMI) 34.0-34.9, adult: Secondary | ICD-10-CM | POA: Diagnosis not present

## 2022-11-22 DIAGNOSIS — E669 Obesity, unspecified: Secondary | ICD-10-CM | POA: Diagnosis not present

## 2022-11-22 MED ORDER — SEMAGLUTIDE-WEIGHT MANAGEMENT 0.25 MG/0.5ML ~~LOC~~ SOAJ: 0.2500 mg | SUBCUTANEOUS | 0 refills | Status: DC

## 2022-11-22 MED ORDER — SEMAGLUTIDE-WEIGHT MANAGEMENT 1 MG/0.5ML ~~LOC~~ SOAJ: 1.0000 mg | SUBCUTANEOUS | 0 refills | Status: DC

## 2022-11-22 MED ORDER — SEMAGLUTIDE-WEIGHT MANAGEMENT 0.5 MG/0.5ML ~~LOC~~ SOAJ: 0.5000 mg | SUBCUTANEOUS | 0 refills | Status: DC

## 2022-11-22 NOTE — Assessment & Plan Note (Signed)
Decision making we will try Wegovy weekly injection for weight loss management.  Start 0.25 mg/week x 4 doses, increase to 0.5 mg/week x 4 doses, increase to 1.0 mg/week x 4 doses, and then reassess to determine how she is tolerating and if we can increase dose further at that point.  Patient warned about possible side effects and what to do if these were to occur.  Patient also educated on how to administer medication and how to properly dispose of needles.  Patient reports her understanding, she will follow-up as scheduled in February or sooner as needed.

## 2022-11-22 NOTE — Progress Notes (Signed)
Established Patient Office Visit  An audio/visual tele-health visit was completed today for this patient. I connected with  Nichole Zamora on 11/22/22 utilizing audio/visual technology and verified that I am speaking with the correct person using two identifiers. The patient was located at their home, and I was located at the office of [facility] at [facility] during the encounter. I discussed the limitations of evaluation and management by telemedicine. The patient expressed understanding and agreed to proceed.     Subjective   Patient ID: Nichole Zamora, female    DOB: October 28, 1976  Age: 46 y.o. MRN: [id number]  Chief Complaint  Patient presents with   Weight Loss    Would like to discuss starting on Wegovy    Patient arrives for A/V visit to discuss weight loss treatment options.  Starting BMI is 34.49.  She reports that she has struggled with weight since childhood.  She has tried and failed both pharmacological and lifestyle treatments.  She reports that she has taken phentermine in the past about 4-5 times, first time being in 2006.  She was successful in weight loss when taking phentermine, however when the phentermine was discontinued she regained her weight.  Additionally she was experiencing sensation of fast heart rate when she was taking the phentermine and she has a history of high blood pressure for which she is treated with hydrochlorothiazide.  She is also tried multiple dietary changes including following low dairy as well as paleo diet diets.  She has lost weight with calorie restrictive and start restrictive diets, but has been unable to maintain weight loss.  She does exercise regularly.  Reports walking at least 80 minutes a week, and doing at least 40 minutes of strength training per week.  She also drinks at least 64 ounces of water per day, and some days will drink up to 88 ounces of water.  Patient denies any personal or family history of thyroid cancer, also  denies any personal history of pancreatitis or gallbladder disease.    ROS: see hpi    Objective:     Ht 5\' 3"  (1.6 m)   Wt 194 lb 11.2 oz (88.3 kg)   LMP 10/17/2022   BMI 34.49 kg/m    Physical Exam Comprehensive physical exam not completed today as office visit was conducted remotely.  Patient appears well on video.  Patient was alert and oriented, and appeared to have appropriate judgment.   No results found for any visits on 11/22/22.    The 10-year ASCVD risk score (Arnett DK, et al., 2019) is: 0.6%    Assessment & Plan:   Problem List Items Addressed This Visit       Other   Class 1 obesity with serious comorbidity and body mass index (BMI) of 34.0 to 34.9 in adult - Primary    Decision making we will try [facility] weekly injection for weight loss management.  Start 0.25 mg/week x 4 doses, increase to 0.5 mg/week x 4 doses, increase to 1.0 mg/week x 4 doses, and then reassess to determine how she is tolerating and if we can increase dose further at that point.  Patient warned about possible side effects and what to do if these were to occur.  Patient also educated on how to administer medication and how to properly dispose of needles.  Patient reports her understanding, she will follow-up as scheduled in February or sooner as needed.      Relevant Medications   Semaglutide-Weight  Management 0.25 MG/0.5ML SOAJ   Semaglutide-Weight Management 0.5 MG/0.5ML SOAJ (Start on 12/21/2022)   Semaglutide-Weight Management 1 MG/0.5ML SOAJ (Start on 01/19/2023)    Return for As scheduled or sooner as needed.Marland Kitchen    Ailene Ards, NP

## 2022-11-22 NOTE — Telephone Encounter (Signed)
Please initiate prior authorization for Wegovy to patient's insurance.  She already looked into coverage and found online that prior authorization is required.  I have ordered initial prescription today, you can use office visit note from today to assist you with prior authorization if needed.  Please notify me if you have any questions.  Thank you.

## 2022-11-23 NOTE — Telephone Encounter (Signed)
PA has been started.  (Key: [id number])

## 2022-11-24 ENCOUNTER — Telehealth: Payer: BC Managed Care – PPO | Admitting: Nurse Practitioner

## 2022-11-24 ENCOUNTER — Encounter: Payer: Self-pay | Admitting: Nurse Practitioner

## 2022-11-24 NOTE — Telephone Encounter (Signed)
New PA started with [employer] and it has been APPROVED  Key: [id number])

## 2022-11-25 ENCOUNTER — Encounter: Payer: Self-pay | Admitting: Nurse Practitioner

## 2022-12-07 ENCOUNTER — Encounter: Payer: Self-pay | Admitting: Nurse Practitioner

## 2022-12-09 ENCOUNTER — Ambulatory Visit: Payer: BC Managed Care – PPO | Admitting: Nurse Practitioner

## 2022-12-14 DIAGNOSIS — S134XXA Sprain of ligaments of cervical spine, initial encounter: Secondary | ICD-10-CM | POA: Diagnosis not present

## 2022-12-14 DIAGNOSIS — S338XXA Sprain of other parts of lumbar spine and pelvis, initial encounter: Secondary | ICD-10-CM | POA: Diagnosis not present

## 2022-12-14 DIAGNOSIS — S233XXA Sprain of ligaments of thoracic spine, initial encounter: Secondary | ICD-10-CM | POA: Diagnosis not present

## 2022-12-15 ENCOUNTER — Ambulatory Visit: Payer: BC Managed Care – PPO | Admitting: Physician Assistant

## 2022-12-15 ENCOUNTER — Other Ambulatory Visit: Payer: Self-pay | Admitting: Nurse Practitioner

## 2022-12-15 DIAGNOSIS — E669 Obesity, unspecified: Secondary | ICD-10-CM

## 2022-12-15 MED ORDER — ZEPBOUND 5 MG/0.5ML ~~LOC~~ SOAJ: 5.0000 mg | SUBCUTANEOUS | 1 refills | Status: DC

## 2022-12-15 MED ORDER — ZEPBOUND 2.5 MG/0.5ML ~~LOC~~ SOAJ: 2.5000 mg | SUBCUTANEOUS | 0 refills | Status: DC

## 2022-12-15 NOTE — Progress Notes (Signed)
Please initiate prior authorization and then let patient know whether or not it was approved by her insurance. Thank you.

## 2022-12-16 NOTE — Progress Notes (Signed)
Pt PA for ZepBound send to plan Key: [id number] PA is approved

## 2022-12-19 NOTE — Telephone Encounter (Signed)
Spoke t pt in regards to [employer] and let them know is was approved last Friday, pt stated she saw that and was able to pick up medication at pharmacy

## 2022-12-23 ENCOUNTER — Encounter: Payer: Self-pay | Admitting: Nurse Practitioner

## 2023-01-04 DIAGNOSIS — S233XXA Sprain of ligaments of thoracic spine, initial encounter: Secondary | ICD-10-CM | POA: Diagnosis not present

## 2023-01-04 DIAGNOSIS — S134XXA Sprain of ligaments of cervical spine, initial encounter: Secondary | ICD-10-CM | POA: Diagnosis not present

## 2023-01-04 DIAGNOSIS — S338XXA Sprain of other parts of lumbar spine and pelvis, initial encounter: Secondary | ICD-10-CM | POA: Diagnosis not present

## 2023-01-18 ENCOUNTER — Encounter: Payer: Self-pay | Admitting: Physician Assistant

## 2023-01-18 ENCOUNTER — Ambulatory Visit: Payer: BC Managed Care – PPO | Admitting: Physician Assistant

## 2023-01-18 VITALS — BP 102/62 | HR 76 | Ht 63.0 in | Wt 188.0 lb

## 2023-01-18 DIAGNOSIS — Z1211 Encounter for screening for malignant neoplasm of colon: Secondary | ICD-10-CM

## 2023-01-18 MED ORDER — PLENVU 140 G PO SOLR: 1.0000 | ORAL | 0 refills | Status: DC

## 2023-01-18 NOTE — Patient Instructions (Addendum)
If you are age 46 or older, your body mass index should be between 23-30. Your Body mass index is 33.3 kg/m. If this is out of the aforementioned range listed, please consider follow up with your Primary Care Provider.  If you are age 17 or younger, your body mass index should be between 19-25. Your Body mass index is 33.3 kg/m. If this is out of the aformentioned range listed, please consider follow up with your Primary Care Provider.   ________________________________________________________  We have sent the following medications to your pharmacy for you to pick up at your convenience: Plenvu  You have been scheduled for a colonoscopy. Please follow written instructions given to you at your visit today.  Please pick up your prep supplies at the pharmacy within the next 1-3 days. If you use inhalers (even only as needed), please bring them with you on the day of your procedure.  Thank you for entrusting me with your care and for choosing [place], Amy Genia Harold, P.A.- C.

## 2023-01-18 NOTE — Progress Notes (Signed)
Subjective:    Patient ID: Nichole Zamora, female    DOB: 23-Jun-1976, 47 y.o.   MRN: [id number]  HPI  Nichole "Arrie Aran" is a 47 year old female, who was initially seen here in August 2023 as a new patient by myself with complaints of bilateral lower quadrant abdominal pain, sharp in nature which had been intermittent and persistent for the previous 8 to 9 months.  She also has chronic constipation.  Initial plan was for CT of the abdomen and pelvis.  She had been taking an over-the-counter herbal supplement for constipation management which had been working well. She also had some complaints of upper abdominal discomfort and intermittent heartburn.  She had had a normal barium swallow in February 2022. H. pylori antibody was checked and negative.  Sed rate was mildly elevated at 23. Patient decided not to pursue the CT scan.  She says she had gone to see her massage therapist and had her stomach adjusted down from her diaphragm and after that all of her symptoms resolved.  She says she has not had any issues with lower abdominal pain since and as long as she takes her Clens more supplement she is having regular bowel movements without difficulty, no melena or hematochezia.  She also cut out some of her dairy consumption.  She says she consumes minimal gluten but that had not changed. She comes back in today to discuss a screening colonoscopy on the advice of her PCP Nichole Ruths, NP. She has no family history of colon cancer that she is aware of, no prior colon screening. Other problems include mild GERD for which she takes Pepcid 40 mg daily, and Gilbert's.  Review of Systems Pertinent positive and negative review of systems were noted in the above HPI section.  All other review of systems was otherwise negative.   Outpatient Encounter Medications as of 01/18/2023  Medication Sig   Bacillus Coagulans-Inulin (PROBIOTIC) 1-250 BILLION-MG CAPS Take 1 capsule by mouth daily.   BETAINE PO Take by mouth.    Cholecalciferol (VITAMIN D-3) 125 MCG (5000 UT) TABS Take 1 tablet by mouth daily.    famotidine (PEPCID) 40 MG tablet Take 1 tablet (40 mg total) by mouth daily.   hydrochlorothiazide (MICROZIDE) 12.5 MG capsule Take 1 capsule by mouth once daily   OVER THE COUNTER MEDICATION Adapticin 1 tablet daily   PEG-KCl-NaCl-NaSulf-Na Asc-C (PLENVU) 140 g SOLR Take 1 kit by mouth as directed. Use coupon: BIN: [id number] PNC: CNRX Group: [id number] ID: [id number]   progesterone (PROMETRIUM) 200 MG capsule Take 1 capsule (200 mg total) by mouth daily. (Patient taking differently: Take 200 mg by mouth daily as needed.)   tirzepatide (ZEPBOUND) 5 MG/0.5ML Pen Inject 5 mg into the skin once a week.   UNABLE TO FIND Take 2 capsules by mouth daily. Med Name: Thyroxal   clotrimazole-betamethasone (LOTRISONE) cream Apply 1 Application topically daily. (Patient not taking: Reported on 01/18/2023)   Cobalamin Combinations (VITAMIN B12-FOLIC ACID PO) Take 1 tablet by mouth daily. (Patient not taking: Reported on 01/18/2023)   Magnesium 500 MG CAPS Take 1 capsule by mouth daily. (Patient not taking: Reported on 01/18/2023)   potassium chloride SA (KLOR-CON M) 20 MEQ tablet Take 1 tablet (20 mEq total) by mouth 2 (two) times daily for 4 days.   [DISCONTINUED] tirzepatide (ZEPBOUND) 2.5 MG/0.5ML Pen Inject 2.5 mg into the skin once a week.   No facility-administered encounter medications on file as of 01/18/2023.   Allergies  Allergen Reactions  Codeine Other (See Comments)    Dizziness, Light headed Unknown.    Patient Active Problem List   Diagnosis Date Noted   Class 1 obesity with serious comorbidity and body mass index (BMI) of 34.0 to 34.9 in adult 11/22/2022   Hypokalemia 11/10/2022   Gastroesophageal reflux disease 11/10/2022   Gilbert's syndrome 11/10/2022   COVID-19 09/09/2022   Vitamin D deficiency 06/30/2022   Rash 06/30/2022   Abnormal cervical Papanicolaou smear 06/08/2022   Acute non-recurrent  frontal sinusitis 05/27/2022   Positive ANA (antinuclear antibody) 04/13/2022   Other fatigue 04/13/2022   Hypothyroidism 01/20/2020   PMS (premenstrual syndrome) 11/12/2019   Intractable headache 02/09/2016   Social History   Socioeconomic History   Marital status: Divorced    Spouse name: Not on file   Number of children: Not on file   Years of education: Not on file   Highest education level: Not on file  Occupational History   Not on file  Tobacco Use   Smoking status: Never   Smokeless tobacco: Never  Vaping Use   Vaping Use: Never used  Substance and Sexual Activity   Alcohol use: Not Currently   Drug use: Never   Sexual activity: Yes  Other Topics Concern   Not on file  Social History Narrative   Divorced 19 years.Lives with her son.[employer] [facility].   Social Determinants of Health   Financial Resource Strain: Not on file  Food Insecurity: Not on file  Transportation Needs: Not on file  Physical Activity: Not on file  Stress: Not on file  Social Connections: Not on file  Intimate Partner Violence: Not on file    [name]'s family history includes Diabetes in her brother, father, and mother.      Objective:    Vitals:   01/18/23 1505  BP: 102/62  Pulse: 76  SpO2: 99%    Physical Exam Well-developed well-nourished  WF  in no acute distress.  Height, Weight,188  BMI 33.3  HEENT; nontraumatic normocephalic, EOMI, PE R LA, sclera anicteric. Oropharynx;not done Neck; supple, no JVD Cardiovascular; regular rate and rhythm with S1-S2, no murmur rub or gallop Pulmonary; Clear bilaterally Abdomen; soft, nontender, nondistended, no palpable mass or hepatosplenomegaly, bowel sounds are active Rectal;not done Skin; benign exam, no jaundice rash or appreciable lesions Extremities; no clubbing cyanosis or edema skin warm and dry Neuro/Psych; alert and oriented x4, grossly nonfocal mood and affect appropriate        Assessment & Plan:    #41 47 year old white female seen here previously in August 2023 with several month history of sharp bilateral lower quadrant abdominal pain.  Those symptoms have completely resolved. Initially  CT was to be obtained but patient decided not to pursue that  #2 chronic mild constipation-very stable with OTC supplement "cleanse more" #3 colon cancer screening-average risk, no family history \#4 mild GERD stable on Pepcid 40 mg once daily  Plan; patient will be scheduled for colonoscopy with Dr. Tarri Glenn.  Procedure was discussed in detail with the patient including indications risk benefits and she is agreeable to proceed. Further recommendations pending findings at colonoscopy.  Kaprice Kage Genia Harold PA-C 01/18/2023   Cc: Ailene Ards, NP

## 2023-01-18 NOTE — Progress Notes (Signed)
Reviewed and agree with management plans. ? ?Aditi Rovira L. Nyaja [place], MD, MPH  ?

## 2023-01-25 DIAGNOSIS — S338XXA Sprain of other parts of lumbar spine and pelvis, initial encounter: Secondary | ICD-10-CM | POA: Diagnosis not present

## 2023-01-25 DIAGNOSIS — S134XXA Sprain of ligaments of cervical spine, initial encounter: Secondary | ICD-10-CM | POA: Diagnosis not present

## 2023-01-25 DIAGNOSIS — S233XXA Sprain of ligaments of thoracic spine, initial encounter: Secondary | ICD-10-CM | POA: Diagnosis not present

## 2023-02-09 ENCOUNTER — Ambulatory Visit: Payer: BC Managed Care – PPO | Admitting: Nurse Practitioner

## 2023-02-09 VITALS — BP 102/78 | HR 82 | Temp 97.8°F | Ht 63.0 in | Wt 181.5 lb

## 2023-02-09 DIAGNOSIS — Z6834 Body mass index (BMI) 34.0-34.9, adult: Secondary | ICD-10-CM

## 2023-02-09 DIAGNOSIS — E559 Vitamin D deficiency, unspecified: Secondary | ICD-10-CM | POA: Diagnosis not present

## 2023-02-09 DIAGNOSIS — E669 Obesity, unspecified: Secondary | ICD-10-CM | POA: Diagnosis not present

## 2023-02-09 DIAGNOSIS — K219 Gastro-esophageal reflux disease without esophagitis: Secondary | ICD-10-CM

## 2023-02-09 MED ORDER — ZEPBOUND 5 MG/0.5ML ~~LOC~~ SOAJ: 5.0000 mg | SUBCUTANEOUS | 1 refills | Status: DC

## 2023-02-09 NOTE — Progress Notes (Signed)
   Established Patient Office Visit  Subjective   Patient ID: Nichole Zamora, female    DOB: 03/13/1976  Age: 47 y.o. MRN: [id number]  No chief complaint on file.   Obesity: Onset bone 5 mg weekly injection, tolerating well.  Has been seeing weight loss. Gerd: Is taking famotidine as needed, also using over-the-counter digestive enzymes to see if she can come off of the famotidine.  Has seen improvement in symptoms.    Review of Systems  Cardiovascular:  Negative for chest pain.  Gastrointestinal:  Negative for abdominal pain, constipation and vomiting.      Objective:     BP 102/78   Pulse 82   Temp 97.8 F (36.6 C) (Temporal)   Ht 5' 3"$  (1.6 m)   Wt 181 lb 8 oz (82.3 kg)   SpO2 98%   BMI 32.15 kg/m  BP Readings from Last 3 Encounters:  02/09/23 102/78  01/18/23 102/62  11/10/22 118/78   Wt Readings from Last 3 Encounters:  02/09/23 181 lb 8 oz (82.3 kg)  01/18/23 188 lb (85.3 kg)  11/22/22 194 lb 11.2 oz (88.3 kg)      Physical Exam Vitals reviewed.  Constitutional:      General: She is not in acute distress.    Appearance: Normal appearance.  HENT:     Head: Normocephalic and atraumatic.  Neck:     Vascular: No carotid bruit.  Cardiovascular:     Rate and Rhythm: Normal rate and regular rhythm.     Pulses: Normal pulses.     Heart sounds: Normal heart sounds.  Pulmonary:     Effort: Pulmonary effort is normal.     Breath sounds: Normal breath sounds.  Skin:    General: Skin is warm and dry.  Neurological:     General: No focal deficit present.     Mental Status: She is alert and oriented to person, place, and time.  Psychiatric:        Mood and Affect: Mood normal.        Behavior: Behavior normal.        Judgment: Judgment normal.      No results found for any visits on 02/09/23.    The 10-year ASCVD risk score (Arnett DK, et al., 2019) is: 0.5%    Assessment & Plan:   Problem List Items Addressed This Visit       Digestive    Gastroesophageal reflux disease    Chronic, patient to continue using famotidine as needed.        Other   Vitamin D deficiency - Primary    Patient return to office in approximately 3 to 6 months for comprehensive physical exam, plan to get labs prior to appointment.  Labs ordered, further recommendations may be made based upon these results.      Relevant Orders   TSH   Hemoglobin A1c   Lipid panel   Comprehensive metabolic panel   CBC   VITAMIN D 25 Hydroxy (Vit-D Deficiency, Fractures)   Obesity (BMI 30-39.9)    Chronic, starting BMI 34.49.  Current BMI 32.15.  Is tolerating Zepbound 0.5 mg weekly injection, is seeing weight loss with this dose.  For now continue on same.      Relevant Medications   tirzepatide (ZEPBOUND) 5 MG/0.5ML Pen    Return in about 3 months (around 05/10/2023) for CPE with Aariana Shankland in 3-6 months.    Ailene Ards, NP

## 2023-02-09 NOTE — Assessment & Plan Note (Signed)
Patient return to office in approximately 3 to 6 months for comprehensive physical exam, plan to get labs prior to appointment.  Labs ordered, further recommendations may be made based upon these results.

## 2023-02-09 NOTE — Assessment & Plan Note (Signed)
Chronic, starting BMI 34.49.  Current BMI 32.15.  Is tolerating Zepbound 0.5 mg weekly injection, is seeing weight loss with this dose.  For now continue on same.

## 2023-02-09 NOTE — Assessment & Plan Note (Signed)
Chronic, patient to continue using famotidine as needed.

## 2023-02-14 ENCOUNTER — Encounter: Payer: Self-pay | Admitting: Gastroenterology

## 2023-02-15 ENCOUNTER — Encounter: Payer: Self-pay | Admitting: Gastroenterology

## 2023-02-23 ENCOUNTER — Encounter: Payer: Self-pay | Admitting: Gastroenterology

## 2023-02-23 ENCOUNTER — Ambulatory Visit (AMBULATORY_SURGERY_CENTER): Payer: BC Managed Care – PPO | Admitting: Gastroenterology

## 2023-02-23 VITALS — BP 108/70 | HR 77 | Temp 98.0°F | Resp 16 | Ht 63.0 in | Wt 188.0 lb

## 2023-02-23 DIAGNOSIS — Z1211 Encounter for screening for malignant neoplasm of colon: Secondary | ICD-10-CM

## 2023-02-23 MED ORDER — SODIUM CHLORIDE 0.9 % IV SOLN: 500.0000 mL | Freq: Once | INTRAVENOUS | Status: DC

## 2023-02-23 NOTE — Progress Notes (Signed)
Report to PACU, RN, vss, BBS= Clear.  

## 2023-02-23 NOTE — Progress Notes (Signed)
Referring Provider: Ailene Ards, NP Primary Care Physician:  Ailene Ards, NP  Indication for Colonoscopy:  Colon cancer screening   IMPRESSION:  Need for colon cancer screening Appropriate candidate for monitored anesthesia care  PLAN: Colonoscopy in the [facility] today   HPI: Nichole Zamora is a 47 y.o. female presents for screening colonoscopy.  No prior colonoscopy or colon cancer screening.  No known family history of colon cancer or polyps. No family history of uterine/endometrial cancer, pancreatic cancer or gastric/stomach cancer.   Past Medical History:  Diagnosis Date   Essential hypertension, benign 11/12/2019   Rosanna Randy syndrome    HLD (hyperlipidemia) 11/12/2019   Obesity (BMI 30.0-34.9) 11/12/2019   PMS (premenstrual syndrome) 11/12/2019   Thyroid disease    Vitamin D deficiency disease 11/12/2019    Past Surgical History:  Procedure Laterality Date   LAMINECTOMY  2012   SEPTOPLASTY     TUBAL LIGATION  2004    Current Outpatient Medications  Medication Sig Dispense Refill   Bacillus Coagulans-Inulin (PROBIOTIC) 1-250 BILLION-MG CAPS Take 1 capsule by mouth daily.     Cholecalciferol (VITAMIN D-3) 125 MCG (5000 UT) TABS Take 1 tablet by mouth daily.      Cobalamin Combinations (VITAMIN B12-FOLIC ACID PO) Take 1 tablet by mouth daily.     famotidine (PEPCID) 40 MG tablet Take 1 tablet (40 mg total) by mouth daily. 90 tablet 1   hydrochlorothiazide (MICROZIDE) 12.5 MG capsule Take 1 capsule by mouth once daily 90 capsule 0   Magnesium 500 MG CAPS Take 1 capsule by mouth daily.     tirzepatide (ZEPBOUND) 5 MG/0.5ML Pen Inject 5 mg into the skin once a week. 6 mL 1   progesterone (PROMETRIUM) 200 MG capsule Take 1 capsule (200 mg total) by mouth daily. (Patient taking differently: Take 200 mg by mouth daily as needed.) 90 capsule 1   Current Facility-Administered Medications  Medication Dose Route Frequency Provider Last Rate Last Admin   0.9 %  sodium  chloride infusion  500 mL Intravenous Once Thornton Park, MD        Allergies as of 02/23/2023 - Review Complete 02/23/2023  Allergen Reaction Noted   Codeine Other (See Comments) 05/12/2014    Family History  Problem Relation Age of Onset   Diabetes Mother    Diabetes Father    Diabetes Brother    Allergic rhinitis Neg Hx    Angioedema Neg Hx    Asthma Neg Hx    Atopy Neg Hx    Eczema Neg Hx    Immunodeficiency Neg Hx    Urticaria Neg Hx    Colon cancer Neg Hx    Colon polyps Neg Hx    Stomach cancer Neg Hx    Rectal cancer Neg Hx    Esophageal cancer Neg Hx    Liver cancer Neg Hx    Pancreatic cancer Neg Hx      Physical Exam: General:   Alert,  well-nourished, pleasant and cooperative in NAD Head:  Normocephalic and atraumatic. Eyes:  Sclera clear, no icterus.   Conjunctiva pink. Mouth:  No deformity or lesions.   Neck:  Supple; no masses or thyromegaly. Lungs:  Clear throughout to auscultation.   No wheezes. Heart:  Regular rate and rhythm; no murmurs. Abdomen:  Soft, non-tender, nondistended, normal bowel sounds, no rebound or guarding.  Msk:  Symmetrical. No boney deformities LAD: No inguinal or umbilical LAD Extremities:  No clubbing or edema. Neurologic:  Alert and  oriented x4;  grossly nonfocal Skin:  No obvious rash or bruise. Psych:  Alert and cooperative. Normal mood and affect.     Studies/Results: No results found.    Raeleen Winstanley L. Tarri Glenn, MD, MPH 02/23/2023, 11:32 AM

## 2023-02-23 NOTE — Op Note (Signed)
[facility] Patient Name: Nichole Zamora Procedure Date: 02/23/2023 11:34 AM MRN: [id number] Endoscopist: Thornton Park MD, MD, [id number] Age: 47 Referring MD:  Date of Birth: 1976/01/21 Gender: Female Account #: [id number] Procedure:                Colonoscopy Indications:              Screening for colorectal malignant neoplasm, This                            is the patient's first colonoscopy                           No known family history of colon cancer or polyps Medicines:                Monitored Anesthesia Care Procedure:                Pre-Anesthesia Assessment:                           - Prior to the procedure, a History and Physical                            was performed, and patient medications and                            allergies were reviewed. The patient's tolerance of                            previous anesthesia was also reviewed. The risks                            and benefits of the procedure and the sedation                            options and risks were discussed with the patient.                            All questions were answered, and informed consent                            was obtained. Prior Anticoagulants: The patient has                            taken no anticoagulant or antiplatelet agents. ASA                            Grade Assessment: II - A patient with mild systemic                            disease. After reviewing the risks and benefits,                            the patient was deemed in satisfactory condition to  undergo the procedure.                           After obtaining informed consent, the colonoscope                            was passed under direct vision. Throughout the                            procedure, the patient's blood pressure, pulse, and                            oxygen saturations were monitored continuously. The                            Olympus CF-HQ190L  [id number]) Colonoscope was                            introduced through the anus and advanced to the 3                            cm into the ileum. A second forward view of the                            right colon. The colonoscopy was performed without                            difficulty. The patient tolerated the procedure                            well. The quality of the bowel preparation was                            excellent. The terminal ileum, ileocecal valve,                            appendiceal orifice, and rectum were photographed. Scope In: 11:38:38 AM Scope Out: 11:49:29 AM Scope Withdrawal Time: 0 hours 8 minutes 23 seconds  Total Procedure Duration: 0 hours 10 minutes 51 seconds  Findings:                 The perianal and digital rectal examinations were                            normal.                           A diffuse area of severely melanotic mucosa was                            found in the entire colon.                           The exam was otherwise without abnormality on  direct and retroflexion views. Complications:            No immediate complications. Estimated Blood Loss:     Estimated blood loss: none. Impression:               - Melanotic mucosa in the entire examined colon.                           - The examination was otherwise normal on direct                            and retroflexion views.                           - No specimens collected. Recommendation:           - Patient has a contact number available for                            emergencies. The signs and symptoms of potential                            delayed complications were discussed with the                            patient. Return to normal activities tomorrow.                            Written discharge instructions were provided to the                            patient.                           - Resume previous diet.                            - Continue present medications.                           - Repeat colonoscopy in 10 years for surveillance,                            earlier with new symptoms.                           - Emerging evidence supports eating a diet of                            fruits, vegetables, grains, calcium, and yogurt                            while reducing red meat and alcohol may reduce the                            risk of colon cancer.                           -  Thank you for allowing me to be involved in your                            colon cancer prevention. Thornton Park MD, MD 02/23/2023 11:54:58 AM This report has been signed electronically.

## 2023-02-23 NOTE — Patient Instructions (Signed)
Resume previous diet Continue present medicaitons Repeat colonoscopy in 10 years  YOU HAD AN ENDOSCOPIC PROCEDURE TODAY AT [facility]:   Refer to the procedure report that was given to you for any specific questions about what was found during the examination.  If the procedure report does not answer your questions, please call your gastroenterologist to clarify.  If you requested that your care partner not be given the details of your procedure findings, then the procedure report has been included in a sealed envelope for you to review at your convenience later.  YOU SHOULD EXPECT: Some feelings of bloating in the abdomen. Passage of more gas than usual.  Walking can help get rid of the air that was put into your GI tract during the procedure and reduce the bloating. If you had a lower endoscopy (such as a colonoscopy or flexible sigmoidoscopy) you may notice spotting of blood in your stool or on the toilet paper. If you underwent a bowel prep for your procedure, you may not have a normal bowel movement for a few days.  Please Note:  You might notice some irritation and congestion in your nose or some drainage.  This is from the oxygen used during your procedure.  There is no need for concern and it should clear up in a day or so.  SYMPTOMS TO REPORT IMMEDIATELY: Following lower endoscopy (colonoscopy):  Excessive amounts of blood in the stool  Significant tenderness or worsening of abdominal pains  Swelling of the abdomen that is new, acute  Fever of 100F or higher For urgent or emergent issues, a gastroenterologist can be reached at any hour by calling [phone]. Do not use MyChart messaging for urgent concerns.   DIET:  We do recommend a small meal at first, but then you may proceed to your regular diet.  Drink plenty of fluids but you should avoid alcoholic beverages for 24 hours.  ACTIVITY:  You should plan to take it easy for the rest of today and you should NOT  DRIVE or use heavy machinery until tomorrow (because of the sedation medicines used during the test).    FOLLOW UP: Our staff will call the number listed on your records the next business day following your procedure.  We will call around 7:15- 8:00 am to check on you and address any questions or concerns that you may have regarding the information given to you following your procedure. If we do not reach you, we will leave a message.     SIGNATURES/CONFIDENTIALITY: You and/or your care partner have signed paperwork which will be entered into your electronic medical record.  These signatures attest to the fact that that the information above on your After Visit Summary has been reviewed and is understood.  Full responsibility of the confidentiality of this discharge information lies with you and/or your care-partner.

## 2023-02-23 NOTE — Progress Notes (Signed)
Pt's states no medical or surgical changes since previsit or office visit. 

## 2023-02-24 ENCOUNTER — Telehealth: Payer: Self-pay | Admitting: *Deleted

## 2023-02-24 NOTE — Telephone Encounter (Signed)
  Follow up Call-     02/23/2023   11:16 AM  Call back number  Post procedure Call Back phone  # [phone]  Permission to leave phone message Yes     Patient questions:  Do you have a fever, pain , or abdominal swelling? No. Pain Score  0 *  Have you tolerated food without any problems? Yes.    Have you been able to return to your normal activities? Yes.    Do you have any questions about your discharge instructions: Diet   No. Medications  No. Follow up visit  No.  Do you have questions or concerns about your Care? No.  Actions: * If pain score is 4 or above: No action needed, pain <4.

## 2023-03-01 DIAGNOSIS — S233XXA Sprain of ligaments of thoracic spine, initial encounter: Secondary | ICD-10-CM | POA: Diagnosis not present

## 2023-03-01 DIAGNOSIS — S134XXA Sprain of ligaments of cervical spine, initial encounter: Secondary | ICD-10-CM | POA: Diagnosis not present

## 2023-03-01 DIAGNOSIS — S338XXA Sprain of other parts of lumbar spine and pelvis, initial encounter: Secondary | ICD-10-CM | POA: Diagnosis not present

## 2023-03-07 ENCOUNTER — Encounter: Payer: Self-pay | Admitting: Nurse Practitioner

## 2023-03-07 MED ORDER — PROGESTERONE 200 MG PO CAPS: 200.0000 mg | ORAL_CAPSULE | Freq: Every day | ORAL | 0 refills | Status: DC

## 2023-03-07 MED ORDER — HYDROCHLOROTHIAZIDE 12.5 MG PO CAPS: 12.5000 mg | ORAL_CAPSULE | Freq: Every day | ORAL | 0 refills | Status: DC

## 2023-03-22 ENCOUNTER — Encounter: Payer: Self-pay | Admitting: Nurse Practitioner

## 2023-03-23 ENCOUNTER — Other Ambulatory Visit: Payer: Self-pay | Admitting: Nurse Practitioner

## 2023-03-23 ENCOUNTER — Telehealth: Payer: Self-pay

## 2023-03-23 DIAGNOSIS — E669 Obesity, unspecified: Secondary | ICD-10-CM

## 2023-03-23 MED ORDER — TIRZEPATIDE 7.5 MG/0.5ML ~~LOC~~ SOAJ: 7.5000 mg | SUBCUTANEOUS | 1 refills | Status: DC

## 2023-03-23 MED ORDER — ZEPBOUND 7.5 MG/0.5ML ~~LOC~~ SOAJ: 7.5000 mg | SUBCUTANEOUS | 2 refills | Status: DC

## 2023-03-23 NOTE — Telephone Encounter (Signed)
PA request received via CMM for Mounjaro 7.5MG /0.5ML pen-injectors  Patient failed 5mg  Zepbound  PA has been submitted to [employer] and is pending additional questions/determination  Key: [id number]

## 2023-03-24 ENCOUNTER — Other Ambulatory Visit: Payer: Self-pay | Admitting: Nurse Practitioner

## 2023-03-24 DIAGNOSIS — E669 Obesity, unspecified: Secondary | ICD-10-CM

## 2023-03-24 MED ORDER — ZEPBOUND 7.5 MG/0.5ML ~~LOC~~ SOAJ
7.5000 mg | SUBCUTANEOUS | 2 refills | Status: DC
Start: 2023-03-24 — End: 2023-06-01

## 2023-03-24 NOTE — Progress Notes (Signed)
Please initiate PA for Zepbound. Thank you.

## 2023-03-27 NOTE — Progress Notes (Signed)
Forwarding msg to [employer]

## 2023-03-29 DIAGNOSIS — S338XXA Sprain of other parts of lumbar spine and pelvis, initial encounter: Secondary | ICD-10-CM | POA: Diagnosis not present

## 2023-03-29 DIAGNOSIS — S233XXA Sprain of ligaments of thoracic spine, initial encounter: Secondary | ICD-10-CM | POA: Diagnosis not present

## 2023-03-29 DIAGNOSIS — S134XXA Sprain of ligaments of cervical spine, initial encounter: Secondary | ICD-10-CM | POA: Diagnosis not present

## 2023-04-06 DIAGNOSIS — R92323 Mammographic fibroglandular density, bilateral breasts: Secondary | ICD-10-CM | POA: Diagnosis not present

## 2023-04-06 DIAGNOSIS — Z1231 Encounter for screening mammogram for malignant neoplasm of breast: Secondary | ICD-10-CM | POA: Diagnosis not present

## 2023-04-07 ENCOUNTER — Other Ambulatory Visit (HOSPITAL_COMMUNITY): Payer: Self-pay

## 2023-04-07 ENCOUNTER — Telehealth: Payer: Self-pay

## 2023-04-07 NOTE — Telephone Encounter (Signed)
Patient Advocate Encounter  Prior Authorization for Zepbound has been approved with [employer].     Effective dates: through 08/13/23  Placed a call to [facility] to notify of the approval. The pharmacist stated she did receive a paid claim for $30 but the medication has been on a backorder and she was not sure when it will come in.

## 2023-04-07 NOTE — Telephone Encounter (Signed)
My chart message send to pt about approval

## 2023-04-13 NOTE — Telephone Encounter (Signed)
Pharmacy Patient Advocate Encounter  Received notification from [employer] that the request for prior authorization for Mounjaro 7.5mg /0.5 PEN  has been denied due to no diagnosis of type 2 diabetes.    Please be advised we currently do not have a Pharmacist to review denials, therefore you will need to process appeals accordingly as needed. Thanks for your support at this time.   You may call [phone] or fax [phone], to appeal.

## 2023-05-03 ENCOUNTER — Ambulatory Visit: Payer: BC Managed Care – PPO | Admitting: Nurse Practitioner

## 2023-05-03 ENCOUNTER — Ambulatory Visit (INDEPENDENT_AMBULATORY_CARE_PROVIDER_SITE_OTHER): Payer: BC Managed Care – PPO

## 2023-05-03 VITALS — BP 100/68 | HR 80 | Temp 98.0°F | Ht 63.0 in | Wt 177.5 lb

## 2023-05-03 DIAGNOSIS — E669 Obesity, unspecified: Secondary | ICD-10-CM

## 2023-05-03 DIAGNOSIS — Z6834 Body mass index (BMI) 34.0-34.9, adult: Secondary | ICD-10-CM

## 2023-05-03 DIAGNOSIS — E559 Vitamin D deficiency, unspecified: Secondary | ICD-10-CM | POA: Diagnosis not present

## 2023-05-03 DIAGNOSIS — M25512 Pain in left shoulder: Secondary | ICD-10-CM

## 2023-05-03 DIAGNOSIS — R768 Other specified abnormal immunological findings in serum: Secondary | ICD-10-CM | POA: Diagnosis not present

## 2023-05-03 DIAGNOSIS — G8929 Other chronic pain: Secondary | ICD-10-CM

## 2023-05-03 NOTE — Progress Notes (Signed)
Established Patient Office Visit  Subjective   Patient ID: Nichole Zamora, female    DOB: 1975/12/24  Age: 47 y.o. MRN: [id number]  Chief Complaint  Patient presents with   Shoulder Pain    Left shoulder pain for about two month now. Do not know the cause of it, just started to noticed pain radiating when she made certain movement     Patient arrives for acute visit for the above Left shoulder pain x 2 months. No trauma or accident prior to pain noted by patient. Pain only occurs towards end of internal/external rotation. 3-4/10 in intensity. No weakness, sensory changes. Has pain on left side when sleeping. Has lost weight, but is also on Zepbound. Using Bayer's back and body or advil without improvement in symptoms.   As stated above patient is on ZepBound for treatment of obesity. Has just increased dose to 7.5mg /week. Tolerating it well. Has had 11# weight loss since her last office visit. Starting BMI 34.49 in 11/2022 and starting weight 194#. This demonstrates ~8% weight loss thus far.     ROS: see HPI    Objective:     BP 100/68   Pulse 80   Temp 98 F (36.7 C) (Temporal)   Ht 5\' 3"  (1.6 m)   Wt 177 lb 8 oz (80.5 kg)   SpO2 97%   BMI 31.44 kg/m  BP Readings from Last 3 Encounters:  05/03/23 100/68  02/23/23 108/70  02/09/23 102/78   Wt Readings from Last 3 Encounters:  05/03/23 177 lb 8 oz (80.5 kg)  02/23/23 188 lb (85.3 kg)  02/09/23 181 lb 8 oz (82.3 kg)      Physical Exam Vitals reviewed.  Constitutional:      General: She is not in acute distress.    Appearance: Normal appearance.  HENT:     Head: Normocephalic and atraumatic.  Neck:     Vascular: No carotid bruit.  Cardiovascular:     Rate and Rhythm: Normal rate and regular rhythm.     Pulses: Normal pulses.     Heart sounds: Normal heart sounds.  Pulmonary:     Effort: Pulmonary effort is normal.     Breath sounds: Normal breath sounds.  Musculoskeletal:     Left shoulder: No swelling,  deformity, effusion, tenderness or bony tenderness. Normal range of motion. Normal strength.  Skin:    General: Skin is warm and dry.  Neurological:     General: No focal deficit present.     Mental Status: She is alert and oriented to person, place, and time.  Psychiatric:        Mood and Affect: Mood normal.        Behavior: Behavior normal.        Judgment: Judgment normal.      No results found for any visits on 05/03/23.    The 10-year ASCVD risk score (Arnett DK, et al., 2019) is: 0.4%    Assessment & Plan:   Problem List Items Addressed This Visit       Other   Positive ANA (antinuclear antibody)    Has had positive ANA in the past, patient requesting we recheck this with her other labs that she has ordered, schedule for upcoming appointment in June. Labs ordered, further recommendations may be made based upon his results       Relevant Orders   CBC   Comprehensive metabolic panel   Hemoglobin A1c   Lipid panel   TSH  Antinuclear Antib (ANA)   VITAMIN D 25 Hydroxy (Vit-D Deficiency, Fractures)   Comprehensive metabolic panel   Hemoglobin A1c   CBC   Lipid panel   TSH   Antinuclear Antib (ANA)   VITAMIN D 25 Hydroxy (Vit-D Deficiency, Fractures)   Vitamin D deficiency    Chronic Collect serum level at next lab draw For now continue supplement of 5000IUs of vitamin D3/day      Relevant Orders   CBC   Comprehensive metabolic panel   Hemoglobin A1c   Lipid panel   TSH   Antinuclear Antib (ANA)   VITAMIN D 25 Hydroxy (Vit-D Deficiency, Fractures)   Comprehensive metabolic panel   Hemoglobin A1c   CBC   Lipid panel   TSH   Antinuclear Antib (ANA)   VITAMIN D 25 Hydroxy (Vit-D Deficiency, Fractures)   Class 1 obesity with serious comorbidity and body mass index (BMI) of 34.0 to 34.9 in adult    Chronic 8% weight loss thus far Tolerating Zepbound 7.5mg /week. Continue on this dose. Labs ordered, further recommendations may be made based upon his  results       Relevant Orders   CBC   Comprehensive metabolic panel   Hemoglobin A1c   Lipid panel   TSH   Antinuclear Antib (ANA)   VITAMIN D 25 Hydroxy (Vit-D Deficiency, Fractures)   Comprehensive metabolic panel   Hemoglobin A1c   CBC   Lipid panel   TSH   Antinuclear Antib (ANA)   VITAMIN D 25 Hydroxy (Vit-D Deficiency, Fractures)   Chronic left shoulder pain - Primary    Subacute/chronic Patient reports pain is getting progressively worse, favor bursitis as no weakness noted subjectively or objectively. Recommend NSAID use, rest.  As pain only occurs with specific movements, is mild-moderate in intensity and patient is trying to lose weight will not treat with steroids at this time.  Will xray to rule out bony abnormality and refer to Sports medicine.       Relevant Orders   DG Shoulder Left   Ambulatory referral to Sports Medicine   CBC   Comprehensive metabolic panel   Hemoglobin A1c   Lipid panel   TSH   Antinuclear Antib (ANA)   VITAMIN D 25 Hydroxy (Vit-D Deficiency, Fractures)   Comprehensive metabolic panel   Hemoglobin A1c   CBC   Lipid panel   TSH   Antinuclear Antib (ANA)   VITAMIN D 25 Hydroxy (Vit-D Deficiency, Fractures)    Return for As scheduled or sooner as needed.    [name], NP

## 2023-05-03 NOTE — Assessment & Plan Note (Signed)
Subacute/chronic Patient reports pain is getting progressively worse, favor bursitis as no weakness noted subjectively or objectively. Recommend NSAID use, rest.  As pain only occurs with specific movements, is mild-moderate in intensity and patient is trying to lose weight will not treat with steroids at this time.  Will xray to rule out bony abnormality and refer to Sports medicine.

## 2023-05-03 NOTE — Assessment & Plan Note (Addendum)
Chronic 8% weight loss thus far Tolerating Zepbound 7.5mg /week. Continue on this dose. Labs ordered, further recommendations may be made based upon his results

## 2023-05-03 NOTE — Patient Instructions (Signed)
Tylenol - take 325 mg by mouth every 6 hours while awake Take an additional ibuprofen OR Advil as needed

## 2023-05-03 NOTE — Assessment & Plan Note (Signed)
Chronic Collect serum level at next lab draw For now continue supplement of 5000IUs of vitamin D3/day

## 2023-05-03 NOTE — Assessment & Plan Note (Signed)
Has had positive ANA in the past, patient requesting we recheck this with her other labs that she has ordered, schedule for upcoming appointment in June. Labs ordered, further recommendations may be made based upon his results

## 2023-05-10 ENCOUNTER — Ambulatory Visit: Payer: BC Managed Care – PPO | Admitting: Family Medicine

## 2023-05-10 ENCOUNTER — Ambulatory Visit: Payer: Self-pay

## 2023-05-10 ENCOUNTER — Other Ambulatory Visit (INDEPENDENT_AMBULATORY_CARE_PROVIDER_SITE_OTHER): Payer: BC Managed Care – PPO

## 2023-05-10 VITALS — BP 118/82 | HR 74 | Ht 63.0 in | Wt 177.0 lb

## 2023-05-10 DIAGNOSIS — M25512 Pain in left shoulder: Secondary | ICD-10-CM

## 2023-05-10 DIAGNOSIS — Z6834 Body mass index (BMI) 34.0-34.9, adult: Secondary | ICD-10-CM

## 2023-05-10 DIAGNOSIS — E559 Vitamin D deficiency, unspecified: Secondary | ICD-10-CM | POA: Diagnosis not present

## 2023-05-10 DIAGNOSIS — R768 Other specified abnormal immunological findings in serum: Secondary | ICD-10-CM | POA: Diagnosis not present

## 2023-05-10 DIAGNOSIS — G8929 Other chronic pain: Secondary | ICD-10-CM

## 2023-05-10 DIAGNOSIS — E669 Obesity, unspecified: Secondary | ICD-10-CM | POA: Diagnosis not present

## 2023-05-10 LAB — LIPID PANEL
Cholesterol: 163 mg/dL (ref 0–200)
HDL: 55.1 mg/dL (ref >39.00)
LDL Cholesterol: 93 mg/dL (ref 0–99)
NonHDL: 107.64
Total CHOL/HDL Ratio: 3
Triglycerides: 75 mg/dL (ref 0.0–149.0)
VLDL: 15 mg/dL (ref 0.0–40.0)

## 2023-05-10 LAB — COMPREHENSIVE METABOLIC PANEL
ALT: 7 U/L (ref 0–35)
AST: 13 U/L (ref 0–37)
Albumin: 4.3 g/dL (ref 3.5–5.2)
Alkaline Phosphatase: 52 U/L (ref 39–117)
BUN: 8 mg/dL (ref 6–23)
CO2: 27 mEq/L (ref 19–32)
Calcium: 9.6 mg/dL (ref 8.4–10.5)
Chloride: 100 mEq/L (ref 96–112)
Creatinine, Ser: 0.8 mg/dL (ref 0.40–1.20)
GFR: 88.79 mL/min (ref >60.00)
Glucose, Bld: 78 mg/dL (ref 70–99)
Potassium: 3.3 mEq/L — ABNORMAL LOW (ref 3.5–5.1)
Sodium: 137 mEq/L (ref 135–145)
Total Bilirubin: 1.4 mg/dL — ABNORMAL HIGH (ref 0.2–1.2)
Total Protein: 7.7 g/dL (ref 6.0–8.3)

## 2023-05-10 LAB — CBC
HCT: 45.4 % (ref 36.0–46.0)
Hemoglobin: 14.9 g/dL (ref 12.0–15.0)
MCHC: 32.8 g/dL (ref 30.0–36.0)
MCV: 84.2 fl (ref 78.0–100.0)
Platelets: 178 10*3/uL (ref 150.0–400.0)
RBC: 5.39 Mil/uL — ABNORMAL HIGH (ref 3.87–5.11)
RDW: 13.6 % (ref 11.5–15.5)
WBC: 9.2 10*3/uL (ref 4.0–10.5)

## 2023-05-10 LAB — TSH: TSH: 3.07 u[IU]/mL (ref 0.35–5.50)

## 2023-05-10 LAB — VITAMIN D 25 HYDROXY (VIT D DEFICIENCY, FRACTURES): VITD: 90.32 ng/mL (ref 30.00–100.00)

## 2023-05-10 LAB — HEMOGLOBIN A1C: Hgb A1c MFr Bld: 5.2 % (ref 4.6–6.5)

## 2023-05-10 NOTE — Progress Notes (Signed)
   Rubin Payor, PhD, LAT, ATC acting as a scribe for Clementeen Graham, MD.  Nichole Zamora is a 47 y.o. female who presents to [facility] at [facility] today for L shoulder pain ongoing for 2+ months. No MOI. She first noticed the pain when laying in bed and reaching over to turn off the lamp. Pt locates pain to deep within the L shoulder joint.   Neck pain: no  Radiates: no Aggravates: L-side lying, IR Treatments tried: IBU, hot showers, salon pas  Dx imaging: 05/03/23 L shoulder XR  Pertinent review of systems: No fevers or chills  Relevant historical information: Hypothyroidism.   Exam:  BP 118/82   Pulse 74   Ht 5\' 3"  (1.6 m)   Wt 177 lb (80.3 kg)   LMP 04/26/2023   SpO2 99%   BMI 31.35 kg/m  General: Well Developed, well nourished, and in no acute distress.   MSK: Left shoulder: Normal-appearing Nontender to palpation. Normal motion pain with abduction and internal rotation.  Intact strength. Mildly positive Hawkins and Neer's test.  Negative Yergason's and speeds test.    Lab and Radiology Results  Diagnostic Limited MSK Ultrasound of: Left shoulder Biceps tendon intact normal. Subscapularis tendon normal-appearing Supraspinatus tendon intact.  Mild subacromial bursitis is present. Infraspinatus tendon is normal-appearing AC joint minimal effusion. Impression: Subacromial bursitis  EXAM: LEFT SHOULDER - 2+ VIEW   COMPARISON:  None Available.   FINDINGS: There is no evidence of fracture or dislocation. Slight subacromial spur. There is no evidence of arthropathy or other focal bone abnormality. Soft tissues are unremarkable.   IMPRESSION: Slight subacromial spur. Otherwise unremarkable radiographs of the left shoulder.     Electronically Signed   By: Narda Rutherford M.D.   On: 05/08/2023 12:29 I, Clementeen Graham, personally (independently) visualized and performed the interpretation of the images attached in this  note.   Assessment and Plan: 47 y.o. female with chronic left shoulder pain thought to be due to impingement and bursitis.  She is a good candidate for trial of PT.  She lives in [place] so we can use [facility] in [place].  Referral placed today. Recheck in 8 weeks.  Return sooner if needed.  Could proceed with injection anytime.  PDMP not reviewed this encounter. Orders Placed This Encounter  Procedures   [place] LIMITED JOINT SPACE STRUCTURES UP LEFT(NO LINKED CHARGES)    Order Specific Question:   Reason for Exam (SYMPTOM  OR DIAGNOSIS REQUIRED)    Answer:   left shoulder pain    Order Specific Question:   Preferred imaging location?    Answer:   [facility] referral to Physical Therapy    Referral Priority:   Routine    Referral Type:   Physical Medicine    Referral Reason:   Specialty Services Required    Requested Specialty:   Physical Therapy    Number of Visits Requested:   1   No orders of the defined types were placed in this encounter.    Discussed warning signs or symptoms. Please see discharge instructions. Patient expresses understanding.   The above documentation has been reviewed and is accurate and complete Clementeen Graham, M.D.

## 2023-05-10 NOTE — Patient Instructions (Addendum)
Thank you for coming in today.   I've referred you to Physical Therapy.  Let us know if you don't hear from them in one week.   Recheck in 8 weeks.   Let me know sooner if this is not working or you have problem.

## 2023-05-11 ENCOUNTER — Other Ambulatory Visit: Payer: Self-pay | Admitting: Nurse Practitioner

## 2023-05-11 ENCOUNTER — Encounter: Payer: Self-pay | Admitting: Nurse Practitioner

## 2023-05-11 DIAGNOSIS — E876 Hypokalemia: Secondary | ICD-10-CM

## 2023-05-11 DIAGNOSIS — D751 Secondary polycythemia: Secondary | ICD-10-CM

## 2023-05-11 MED ORDER — POTASSIUM CHLORIDE CRYS ER 20 MEQ PO TBCR
20.0000 meq | EXTENDED_RELEASE_TABLET | Freq: Every day | ORAL | 3 refills | Status: DC
Start: 2023-05-11 — End: 2023-06-01

## 2023-05-11 NOTE — Addendum Note (Signed)
Addended by: [name] on: 05/11/2023 09:35 AM   Modules accepted: Orders

## 2023-05-12 LAB — ANTI-NUCLEAR AB-TITER (ANA TITER): ANA Titer 1: 1:40 {titer} — ABNORMAL HIGH

## 2023-05-12 LAB — ANA: Anti Nuclear Antibody (ANA): POSITIVE — AB

## 2023-05-22 ENCOUNTER — Encounter: Payer: Self-pay | Admitting: Nurse Practitioner

## 2023-05-26 ENCOUNTER — Other Ambulatory Visit (HOSPITAL_COMMUNITY): Payer: Self-pay

## 2023-05-26 ENCOUNTER — Telehealth: Payer: Self-pay

## 2023-05-26 NOTE — Telephone Encounter (Signed)
Patient Advocate Encounter   Received notification from [employer] that prior authorization is required for Zepbound 7.5MG /0.5ML pen-injectors   Submitted: 05-26-2023 Key [id number]

## 2023-05-26 NOTE — Telephone Encounter (Signed)
Pt need PA for Zepbound

## 2023-05-29 DIAGNOSIS — M25512 Pain in left shoulder: Secondary | ICD-10-CM | POA: Diagnosis not present

## 2023-05-29 DIAGNOSIS — G8929 Other chronic pain: Secondary | ICD-10-CM | POA: Diagnosis not present

## 2023-05-29 DIAGNOSIS — M7552 Bursitis of left shoulder: Secondary | ICD-10-CM | POA: Diagnosis not present

## 2023-05-29 DIAGNOSIS — M7542 Impingement syndrome of left shoulder: Secondary | ICD-10-CM | POA: Diagnosis not present

## 2023-05-29 NOTE — Telephone Encounter (Signed)
My chart message send to pt about PA

## 2023-05-31 ENCOUNTER — Other Ambulatory Visit: Payer: Self-pay | Admitting: Nurse Practitioner

## 2023-06-01 ENCOUNTER — Other Ambulatory Visit: Payer: Self-pay | Admitting: Nurse Practitioner

## 2023-06-01 ENCOUNTER — Ambulatory Visit: Payer: BC Managed Care – PPO | Admitting: Nurse Practitioner

## 2023-06-01 VITALS — BP 118/80 | HR 78 | Temp 97.9°F | Ht 63.0 in | Wt 173.4 lb

## 2023-06-01 DIAGNOSIS — E876 Hypokalemia: Secondary | ICD-10-CM

## 2023-06-01 DIAGNOSIS — Z0000 Encounter for general adult medical examination without abnormal findings: Secondary | ICD-10-CM | POA: Diagnosis not present

## 2023-06-01 DIAGNOSIS — G4700 Insomnia, unspecified: Secondary | ICD-10-CM

## 2023-06-01 DIAGNOSIS — Z6834 Body mass index (BMI) 34.0-34.9, adult: Secondary | ICD-10-CM | POA: Diagnosis not present

## 2023-06-01 DIAGNOSIS — Z0001 Encounter for general adult medical examination with abnormal findings: Secondary | ICD-10-CM

## 2023-06-01 DIAGNOSIS — E669 Obesity, unspecified: Secondary | ICD-10-CM

## 2023-06-01 LAB — BASIC METABOLIC PANEL
BUN: 10 mg/dL (ref 6–23)
CO2: 27 mEq/L (ref 19–32)
Calcium: 9.8 mg/dL (ref 8.4–10.5)
Chloride: 99 mEq/L (ref 96–112)
Creatinine, Ser: 0.74 mg/dL (ref 0.40–1.20)
GFR: 97.46 mL/min (ref >60.00)
Glucose, Bld: 82 mg/dL (ref 70–99)
Potassium: 3.3 mEq/L — ABNORMAL LOW (ref 3.5–5.1)
Sodium: 138 mEq/L (ref 135–145)

## 2023-06-01 NOTE — Progress Notes (Signed)
Complete physical exam  Patient: Nichole Zamora   DOB: 11/25/1976   46 y.o. Female  MRN: [id number]  Subjective:    Chief Complaint  Patient presents with   Annual Exam    Pt states she is here for a physical. No other concerns.    Nichole Zamora is a 47 y.o. female who presents today for a complete physical exam. She reports consuming a  mediterranean  diet. Walks 2 miles/day 5x/week She generally feels fairly well. She reports sleeping poorly. She does have additional problems to discuss today.   Obesity: Was on zepbound 7.5mg /week but started having issues with insurance and possibly coverage denial.  So she is now currently on zepbound 5mg /week and is tolerating it well.   Insomnia: Reports that she has had intermittent difficulty sleeping over the last 5 years or so.  Seems to have been more so over the last 6 to 9 months.  Feels that she gets about 5 hours of consecutive sleep per night, often waking up between 330 and 430 and having a difficult time falling back asleep.  Denies daytime sleepiness/fatigue, nocturia, does not have difficult to control blood pressure, not aware of any witnessed apneic episodes.  She reports today that her mammogram was completed via mobile mammogram unit with [facility] and she tells me that this was done in March and it was normal.  Per chart review I do see mammogram from 04/06/2023 which was negative for evidence of malignancy.  Hypokalemia: Incidental finding on last labs.  Patient is on hydrochlorothiazide, she has started an over-the-counter potassium supplement as she had a hard time tolerating the potassium chloride that was prescribed for her.  Mainly due to pill size.   Most recent fall risk assessment:    06/01/2023    1:07 PM  Fall Risk   Falls in the past year? 0  Number falls in past yr: 0  Injury with Fall? 0  Risk for fall due to : No Fall Risks  Follow up Falls evaluation completed     Most recent depression screenings:     06/01/2023    1:07 PM 05/03/2023    1:44 PM  PHQ 2/9 Scores  PHQ - 2 Score 0 0    Vision:Within last year and Dental: No current dental problems and Receives regular dental care  Past Medical History:  Diagnosis Date   Essential hypertension, benign 11/12/2019   Sullivan Lone syndrome    HLD (hyperlipidemia) 11/12/2019   Obesity (BMI 30.0-34.9) 11/12/2019   PMS (premenstrual syndrome) 11/12/2019   Thyroid disease    Vitamin D deficiency disease 11/12/2019   Social History   Socioeconomic History   Marital status: Divorced    Spouse name: Not on file   Number of children: Not on file   Years of education: Not on file   Highest education level: Bachelor's degree (e.g., BA, AB, BS)  Occupational History   Not on file  Tobacco Use   Smoking status: Never   Smokeless tobacco: Never  Vaping Use   Vaping Use: Never used  Substance and Sexual Activity   Alcohol use: Not Currently   Drug use: Never   Sexual activity: Yes  Other Topics Concern   Not on file  Social History Narrative   Divorced 19 years.Lives with her son.[employer] group.   Social Determinants of Health   Financial Resource Strain: Low Risk  (05/02/2023)   Overall Financial Resource Strain (CARDIA)    Difficulty of Paying  Living Expenses: Not hard at all  Food Insecurity: No Food Insecurity (05/02/2023)   Hunger Vital Sign    Worried About Running Out of Food in the Last Year: Never true    Ran Out of Food in the Last Year: Never true  Transportation Needs: No Transportation Needs (05/02/2023)   PRAPARE - Administrator, Civil Service (Medical): No    Lack of Transportation (Non-Medical): No  Physical Activity: Insufficiently Active (05/02/2023)   Exercise Vital Sign    Days of Exercise per Week: 3 days    Minutes of Exercise per Session: 30 min  Stress: Stress Concern Present (05/02/2023)   [employer] of Occupational Health - Occupational Stress Questionnaire    Feeling of  Stress : To some extent  Social Connections: Moderately Isolated (05/02/2023)   Social Connection and Isolation Panel [NHANES]    Frequency of Communication with Friends and Family: More than three times a week    Frequency of Social Gatherings with Friends and Family: Once a week    Attends Religious Services: More than 4 times per year    Active Member of [employer] or Organizations: No    Attends Engineer, structural: Not on file    Marital Status: Divorced  Catering manager Violence: Not on file   Family History  Problem Relation Age of Onset   Diabetes Mother    Diabetes Father    Diabetes Brother    Allergic rhinitis Neg Hx    Angioedema Neg Hx    Asthma Neg Hx    Atopy Neg Hx    Eczema Neg Hx    Immunodeficiency Neg Hx    Urticaria Neg Hx    Colon cancer Neg Hx    Colon polyps Neg Hx    Stomach cancer Neg Hx    Rectal cancer Neg Hx    Esophageal cancer Neg Hx    Liver cancer Neg Hx    Pancreatic cancer Neg Hx       Patient Care Team: [name], NP as PCP - General (Nurse Practitioner)   Outpatient Medications Prior to Visit  Medication Sig   Bacillus Coagulans-Inulin (PROBIOTIC) 1-250 BILLION-MG CAPS Take 1 capsule by mouth daily.   Cholecalciferol (VITAMIN D-3) 125 MCG (5000 UT) TABS Take 1 tablet by mouth daily.    Cobalamin Combinations (VITAMIN B12-FOLIC ACID PO) Take 1 tablet by mouth daily.   famotidine (PEPCID) 40 MG tablet Take 1 tablet (40 mg total) by mouth daily.   hydrochlorothiazide (MICROZIDE) 12.5 MG capsule TAKE 1 CAPSULE(12.5 MG) BY MOUTH DAILY   Magnesium 500 MG CAPS Take 1 capsule by mouth daily.   Potassium 99 MG TABS Take 1 tablet by mouth daily.   progesterone (PROMETRIUM) 200 MG capsule Take 1 capsule (200 mg total) by mouth daily.   tirzepatide (ZEPBOUND) 5 MG/0.5ML Pen Inject 5 mg into the skin once a week.   [DISCONTINUED] potassium chloride SA (KLOR-CON M) 20 MEQ tablet Take 1 tablet (20 mEq total) by mouth daily.    [DISCONTINUED] tirzepatide (ZEPBOUND) 7.5 MG/0.5ML Pen Inject 7.5 mg into the skin once a week.   No facility-administered medications prior to visit.    Review of Systems  Constitutional:  Positive for weight loss (intentional). Negative for chills, fever and malaise/fatigue.  HENT:  Negative for sinus pain and tinnitus.   Eyes:  Negative for blurred vision and double vision.  Respiratory:  Negative for cough, shortness of breath and wheezing.   Cardiovascular:  Negative for chest pain and palpitations.  Gastrointestinal:  Negative for abdominal pain, blood in stool, nausea and vomiting.  Genitourinary:  Negative for hematuria.  Skin:  Negative for itching and rash.  Neurological:  Negative for dizziness, seizures and loss of consciousness.  Psychiatric/Behavioral:  Negative for depression and suicidal ideas. The patient is not nervous/anxious.        06/01/2023    1:07 PM 05/03/2023    1:44 PM 02/09/2023    1:10 PM  PHQ9 SCORE ONLY  PHQ-9 Total Score 0 0 0         Objective:     BP 118/80 (BP Location: Left Arm, Patient Position: Sitting, Cuff Size: Normal)   Pulse 78   Temp 97.9 F (36.6 C) (Oral)   Ht 5\' 3"  (1.6 m)   Wt 173 lb 6.4 oz (78.7 kg)   LMP 04/26/2023   SpO2 98%   BMI 30.72 kg/m  BP Readings from Last 3 Encounters:  06/01/23 118/80  05/10/23 118/82  05/03/23 100/68   Wt Readings from Last 3 Encounters:  06/01/23 173 lb 6.4 oz (78.7 kg)  05/10/23 177 lb (80.3 kg)  05/03/23 177 lb 8 oz (80.5 kg)      Physical Exam Vitals reviewed.  Constitutional:      Appearance: Normal appearance.  HENT:     Head: Normocephalic and atraumatic.     Right Ear: Tympanic membrane, ear canal and external ear normal.     Left Ear: Tympanic membrane, ear canal and external ear normal.  Eyes:     General:        Right eye: No discharge.        Left eye: No discharge.     Extraocular Movements: Extraocular movements intact.     Conjunctiva/sclera: Conjunctivae  normal.     Pupils: Pupils are equal, round, and reactive to light.  Neck:     Vascular: No carotid bruit.  Cardiovascular:     Rate and Rhythm: Normal rate and regular rhythm.     Pulses: Normal pulses.     Heart sounds: Normal heart sounds. No murmur heard. Pulmonary:     Effort: Pulmonary effort is normal.     Breath sounds: Normal breath sounds.  Chest:     Comments: Chest exam deferred Abdominal:     General: Abdomen is flat. Bowel sounds are normal. There is no distension.     Palpations: Abdomen is soft. There is no mass.     Tenderness: There is no abdominal tenderness.  Musculoskeletal:        General: No tenderness.     Cervical back: Neck supple. No muscular tenderness.     Right lower leg: No edema.     Left lower leg: No edema.  Lymphadenopathy:     Cervical: No cervical adenopathy.     Upper Body:     Right upper body: No supraclavicular adenopathy.     Left upper body: No supraclavicular adenopathy.  Skin:    General: Skin is warm and dry.  Neurological:     General: No focal deficit present.     Mental Status: She is alert and oriented to person, place, and time.     Motor: No weakness.     Gait: Gait normal.  Psychiatric:        Mood and Affect: Mood normal.        Behavior: Behavior normal.        Judgment: Judgment normal.  No results found for any visits on 06/01/23.     Assessment & Plan:    Routine Health Maintenance and Physical Exam  Immunization History  Administered Date(s) Administered   Moderna Sars-Covid-2 Vaccination 02/10/2020, 02/19/2020, 03/12/2020, 03/16/2020, 09/11/2020, 10/04/2020, 06/24/2021   Tdap 02/12/2014   Unspecified SARS-COV-2 Vaccination 02/19/2020, 03/16/2020, 10/04/2020, 06/24/2021    Health Maintenance  Topic Date Due   PAP SMEAR-Modifier  02/10/2023   Hepatitis C Screening  11/23/2023 (Originally 08/17/1995)   HIV Screening  11/23/2023 (Originally 08/16/1992)   COVID-19 Vaccine (12 - 2023-24 season)  12/07/2023 (Originally 08/12/2022)   DTaP/Tdap/Td (2 - Td or Tdap) 02/13/2024   Colonoscopy  02/22/2033   HPV VACCINES  Aged Out   INFLUENZA VACCINE  Discontinued    Discussed health benefits of physical activity, and encouraged her to engage in regular exercise appropriate for her age and condition.  Problem List Items Addressed This Visit       Other   Hypokalemia    Check BMP, further recommendations may be made based upon these results.  For now continue over-the-counter potassium supplement.      Relevant Orders   Basic metabolic panel   Class 1 obesity with serious comorbidity and body mass index (BMI) of 34.0 to 34.9 in adult    Chronic Starting BMI 34.49, starting weight 194 pounds.  Current BMI 30.72, current weight 173 pounds. Patient to continue on Zepbound 5mg /week, may consider increasing to 7.5 mg/week at next office visit.      Relevant Medications   tirzepatide (ZEPBOUND) 5 MG/0.5ML Pen   Encounter for general adult medical examination with abnormal findings - Primary    Due for Pap smear, but [facility]  Up-to-date on hep C screening and HIV screening.  Up-to-date on tetanus vaccine.  Up-to-date on colon cancer screening.  Up-to-date on mammogram. Counseled on screenings.      Insomnia    Chronic, mild Per shared decision making no additional workup at this time, while she does have obesity she does not have many other signs of sleep apnea currently.  Did offer referral for sleep study but patient declined for now.  She was encouraged to let me know if he changes her mind.      Return in about 3 months (around 09/01/2023) for F/U with Maralyn Sago.     [name], NP

## 2023-06-01 NOTE — Assessment & Plan Note (Signed)
Chronic Starting BMI 34.49, starting weight 194 pounds.  Current BMI 30.72, current weight 173 pounds. Patient to continue on Zepbound 5mg /week, may consider increasing to 7.5 mg/week at next office visit.

## 2023-06-01 NOTE — Assessment & Plan Note (Addendum)
Due for Pap smear, but reports [facility].  Up-to-date on hep C screening and HIV screening.  Up-to-date on tetanus vaccine.  Up-to-date on colon cancer screening.  Up-to-date on mammogram. Counseled on screenings.

## 2023-06-01 NOTE — Assessment & Plan Note (Signed)
Chronic, mild Per shared decision making no additional workup at this time, while she does have obesity she does not have many other signs of sleep apnea currently.  Did offer referral for sleep study but patient declined for now.  She was encouraged to let me know if he changes her mind.

## 2023-06-01 NOTE — Assessment & Plan Note (Signed)
Check BMP, further recommendations may be made based upon these results.  For now continue over-the-counter potassium supplement.

## 2023-06-01 NOTE — Progress Notes (Signed)
[facility] [address] [place] [address]   Clinic Day:  06/02/2023  Referring physician: [name], NP  Patient Care Team: [name], NP as PCP - General (Nurse Practitioner)   ASSESSMENT & PLAN:   Assessment:  1.  Erythrocytosis: - Patient seen for elevated RBC count since 2022. - Denies any vasomotor symptoms or aquagenic pruritus.  No thrombosis. - No history of sleep apnea.  She lost about 20 pounds since January 2024 as she was started on Zepbound.  She is on HCTZ 12.5 mg daily since 2008.  Also on progesterone since 2018 to help sleep. - Menstruation: 4-5 days / 28 days, 1.5 days heavy bleeding.  2.  Social/family history: - Works from home.  Non-smoker.  No chemical exposure. - No family history of myeloproliferative neoplasms or malignancies or thalassemia.  Plan:  1.  Erythrocytosis: - We discussed secondary causes of erythrocytosis and clonal causes. - Recommend CBCD, EPO level, JAK2 V617F with reflex testing. - RTC 4 weeks for follow-up.   Orders Placed This Encounter  Procedures   CBC with Differential    Standing Status:   Future    Number of Occurrences:   1    Standing Expiration Date:   06/01/2024   JAK2 V617F rfx CALR/MPL/E12-15    Standing Status:   Future    Number of Occurrences:   1    Standing Expiration Date:   06/01/2024   Erythropoietin    Standing Status:   Future    Number of Occurrences:   1    Standing Expiration Date:   06/01/2024   Reticulocytes    Standing Status:   Future    Number of Occurrences:   1    Standing Expiration Date:   06/01/2024      I,Katie Daubenspeck,acting as a scribe for Doreatha Massed, MD.,have documented all relevant documentation on the behalf of Doreatha Massed, MD,as directed by  Doreatha Massed, MD while in the presence of Doreatha Massed, MD.   I, Doreatha Massed MD, have reviewed the above documentation for accuracy and completeness, and I agree with  the above.   Doreatha Massed, MD   6/21/20242:44 PM  CHIEF COMPLAINT/PURPOSE OF CONSULT:   Diagnosis: erythrocytosis   Current Therapy: Workup  HISTORY OF PRESENT ILLNESS:   Nichole Zamora is a 47 y.o. female presenting to clinic today for evaluation of erythrocytosis at the request of Jiles Prows, NP.  She has had a borderline elevated RBC since around 06/2015, at which time it was 5.2. Most recent CBC from 05/10/23 showed RBC of 5.39. ANA was also performed and was positive, with  nuclear, homogeneous pattern and low titer at 1:40.  Today, she states that she is doing well overall. Her appetite level is at 100%. Her energy level is at 100%.  PAST MEDICAL HISTORY:   Past Medical History: Past Medical History:  Diagnosis Date   Essential hypertension, benign 11/12/2019   Sullivan Lone syndrome    HLD (hyperlipidemia) 11/12/2019   Obesity (BMI 30.0-34.9) 11/12/2019   PMS (premenstrual syndrome) 11/12/2019   Thyroid disease    Vitamin D deficiency disease 11/12/2019    Surgical History: Past Surgical History:  Procedure Laterality Date   LAMINECTOMY  2012   SEPTOPLASTY     TUBAL LIGATION  2004    Social History: Social History   Socioeconomic History   Marital status: Divorced    Spouse name: Not on file   Number of children: Not on  file   Years of education: Not on file   Highest education level: Bachelor's degree (e.g., BA, AB, BS)  Occupational History   Not on file  Tobacco Use   Smoking status: Never   Smokeless tobacco: Never  Vaping Use   Vaping Use: Never used  Substance and Sexual Activity   Alcohol use: Not Currently   Drug use: Never   Sexual activity: Yes  Other Topics Concern   Not on file  Social History Narrative   Divorced 19 years.Lives with her son.[employer] group.   Social Determinants of Health   Financial Resource Strain: Low Risk  (05/02/2023)   Overall Financial Resource Strain (CARDIA)    Difficulty of Paying Living  Expenses: Not hard at all  Food Insecurity: No Food Insecurity (05/02/2023)   Hunger Vital Sign    Worried About Running Out of Food in the Last Year: Never true    Ran Out of Food in the Last Year: Never true  Transportation Needs: No Transportation Needs (05/02/2023)   PRAPARE - Administrator, Civil Service (Medical): No    Lack of Transportation (Non-Medical): No  Physical Activity: Insufficiently Active (05/02/2023)   Exercise Vital Sign    Days of Exercise per Week: 3 days    Minutes of Exercise per Session: 30 min  Stress: Stress Concern Present (05/02/2023)   [employer] of Occupational Health - Occupational Stress Questionnaire    Feeling of Stress : To some extent  Social Connections: Moderately Isolated (05/02/2023)   Social Connection and Isolation Panel [NHANES]    Frequency of Communication with Friends and Family: More than three times a week    Frequency of Social Gatherings with Friends and Family: Once a week    Attends Religious Services: More than 4 times per year    Active Member of [employer] or Organizations: No    Attends Engineer, structural: Not on file    Marital Status: Divorced  Catering manager Violence: Not on file    Family History: Family History  Problem Relation Age of Onset   Diabetes Mother    Diabetes Father    Diabetes Brother    Allergic rhinitis Neg Hx    Angioedema Neg Hx    Asthma Neg Hx    Atopy Neg Hx    Eczema Neg Hx    Immunodeficiency Neg Hx    Urticaria Neg Hx    Colon cancer Neg Hx    Colon polyps Neg Hx    Stomach cancer Neg Hx    Rectal cancer Neg Hx    Esophageal cancer Neg Hx    Liver cancer Neg Hx    Pancreatic cancer Neg Hx     Current Medications:  Current Outpatient Medications:    Bacillus Coagulans-Inulin (PROBIOTIC) 1-250 BILLION-MG CAPS, Take 1 capsule by mouth daily., Disp: , Rfl:    Cholecalciferol (VITAMIN D-3) 125 MCG (5000 UT) TABS, Take 1 tablet by mouth daily. , Disp: , Rfl:     Cobalamin Combinations (VITAMIN B12-FOLIC ACID PO), Take 1 tablet by mouth daily., Disp: , Rfl:    famotidine (PEPCID) 40 MG tablet, Take 1 tablet (40 mg total) by mouth daily., Disp: 90 tablet, Rfl: 1   hydrochlorothiazide (MICROZIDE) 12.5 MG capsule, TAKE 1 CAPSULE(12.5 MG) BY MOUTH DAILY, Disp: 90 capsule, Rfl: 0   Magnesium 500 MG CAPS, Take 1 capsule by mouth daily., Disp: , Rfl:    Potassium 99 MG TABS, Take 1 tablet by  mouth daily., Disp: , Rfl:    progesterone (PROMETRIUM) 200 MG capsule, Take 1 capsule (200 mg total) by mouth daily., Disp: 90 capsule, Rfl: 0   tirzepatide (ZEPBOUND) 5 MG/0.5ML Pen, Inject 5 mg into the skin once a week., Disp: , Rfl:    Allergies: Allergies  Allergen Reactions   Codeine Other (See Comments)    Dizziness, Light headed Unknown.     REVIEW OF SYSTEMS:   Review of Systems  Constitutional:  Negative for chills, fatigue and fever.  HENT:   Negative for lump/mass, mouth sores, nosebleeds, sore throat and trouble swallowing.   Eyes:  Negative for eye problems.  Respiratory:  Negative for cough and shortness of breath.   Cardiovascular:  Negative for chest pain, leg swelling and palpitations.  Gastrointestinal:  Negative for abdominal pain, constipation, diarrhea, nausea and vomiting.  Genitourinary:  Negative for bladder incontinence, difficulty urinating, dysuria, frequency, hematuria and nocturia.   Musculoskeletal:  Negative for arthralgias, back pain, flank pain, myalgias and neck pain.  Skin:  Negative for itching and rash.  Neurological:  Negative for dizziness, headaches and numbness.  Hematological:  Does not bruise/bleed easily.  Psychiatric/Behavioral:  Positive for sleep disturbance. Negative for depression and suicidal ideas. The patient is not nervous/anxious.   All other systems reviewed and are negative.    VITALS:   Blood pressure 125/62, pulse 73, temperature 97.6 F (36.4 C), temperature source Oral, resp. rate 17, height 5'  4" (1.626 m), weight 173 lb 3.2 oz (78.6 kg), last menstrual period 04/26/2023, SpO2 100 %.  Wt Readings from Last 3 Encounters:  06/02/23 173 lb 3.2 oz (78.6 kg)  06/01/23 173 lb 6.4 oz (78.7 kg)  05/10/23 177 lb (80.3 kg)    Body mass index is 29.73 kg/m.   PHYSICAL EXAM:   Physical Exam Vitals and nursing note reviewed. Exam conducted with a chaperone present.  Constitutional:      Appearance: Normal appearance.  Cardiovascular:     Rate and Rhythm: Normal rate and regular rhythm.     Pulses: Normal pulses.     Heart sounds: Normal heart sounds.  Pulmonary:     Effort: Pulmonary effort is normal.     Breath sounds: Normal breath sounds.  Abdominal:     Palpations: Abdomen is soft. There is no hepatomegaly, splenomegaly or mass.     Tenderness: There is no abdominal tenderness.  Musculoskeletal:     Right lower leg: No edema.     Left lower leg: No edema.  Lymphadenopathy:     Cervical: No cervical adenopathy.     Right cervical: No superficial, deep or posterior cervical adenopathy.    Left cervical: No superficial, deep or posterior cervical adenopathy.     Upper Body:     Right upper body: No supraclavicular or axillary adenopathy.     Left upper body: No supraclavicular or axillary adenopathy.  Neurological:     General: No focal deficit present.     Mental Status: She is alert and oriented to person, place, and time.  Psychiatric:        Mood and Affect: Mood normal.        Behavior: Behavior normal.     LABS:      Latest Ref Rng & Units 06/02/2023   12:11 PM 05/10/2023   11:10 AM 08/10/2022    3:51 PM  CBC  WBC 4.0 - 10.5 K/uL 8.0  9.2  7.3   Hemoglobin 12.0 - 15.0 g/dL 15.2  14.9  14.2   Hematocrit 36.0 - 46.0 % 45.5  45.4  43.1   Platelets 150 - 400 K/uL 244  178.0 Repeated and verified X2.  252.0       Latest Ref Rng & Units 06/01/2023    2:01 PM 05/10/2023   11:10 AM 11/10/2022    9:55 AM  CMP  Glucose 70 - 99 mg/dL 82  78  92   BUN 6 - 23 mg/dL  10  8  13    Creatinine 0.40 - 1.20 mg/dL 1.61  0.96  0.45   Sodium 135 - 145 mEq/L 138  137  139   Potassium 3.5 - 5.1 mEq/L 3.3  3.3  3.6   Chloride 96 - 112 mEq/L 99  100  101   CO2 19 - 32 mEq/L 27  27  30    Calcium 8.4 - 10.5 mg/dL 9.8  9.6  9.5   Total Protein 6.0 - 8.3 g/dL  7.7    Total Bilirubin 0.2 - 1.2 mg/dL  1.4    Alkaline Phos 39 - 117 U/L  52    AST 0 - 37 U/L  13    ALT 0 - 35 U/L  7       No results found for: "CEA1", "CEA" / No results found for: "CEA1", "CEA" No results found for: "PSA1" No results found for: "WUJ811" No results found for: "CAN125"  No results found for: "TOTALPROTELP", "ALBUMINELP", "A1GS", "A2GS", "BETS", "BETA2SER", "GAMS", "MSPIKE", "SPEI" No results found for: "TIBC", "FERRITIN", "IRONPCTSAT" No results found for: "LDH"   STUDIES:   No results found.

## 2023-06-02 ENCOUNTER — Inpatient Hospital Stay: Payer: BC Managed Care – PPO | Attending: Hematology | Admitting: Hematology

## 2023-06-02 ENCOUNTER — Inpatient Hospital Stay: Payer: BC Managed Care – PPO

## 2023-06-02 VITALS — BP 125/62 | HR 73 | Temp 97.6°F | Resp 17 | Ht 64.0 in | Wt 173.2 lb

## 2023-06-02 DIAGNOSIS — D751 Secondary polycythemia: Secondary | ICD-10-CM

## 2023-06-02 DIAGNOSIS — Z79899 Other long term (current) drug therapy: Secondary | ICD-10-CM | POA: Diagnosis not present

## 2023-06-02 LAB — CBC WITH DIFFERENTIAL/PLATELET
Abs Immature Granulocytes: 0.02 10*3/uL (ref 0.00–0.07)
Basophils Absolute: 0 10*3/uL (ref 0.0–0.1)
Basophils Relative: 1 %
Eosinophils Absolute: 0.1 10*3/uL (ref 0.0–0.5)
Eosinophils Relative: 1 %
HCT: 45.5 % (ref 36.0–46.0)
Hemoglobin: 15.2 g/dL — ABNORMAL HIGH (ref 12.0–15.0)
Immature Granulocytes: 0 %
Lymphocytes Relative: 25 %
Lymphs Abs: 2 10*3/uL (ref 0.7–4.0)
MCH: 28.3 pg (ref 26.0–34.0)
MCHC: 33.4 g/dL (ref 30.0–36.0)
MCV: 84.6 fL (ref 80.0–100.0)
Monocytes Absolute: 0.3 10*3/uL (ref 0.1–1.0)
Monocytes Relative: 4 %
Neutro Abs: 5.5 10*3/uL (ref 1.7–7.7)
Neutrophils Relative %: 69 %
Platelets: 244 10*3/uL (ref 150–400)
RBC: 5.38 MIL/uL — ABNORMAL HIGH (ref 3.87–5.11)
RDW: 13.5 % (ref 11.5–15.5)
WBC: 8 10*3/uL (ref 4.0–10.5)
nRBC: 0 % (ref 0.0–0.2)

## 2023-06-02 LAB — RETICULOCYTES
Immature Retic Fract: 8.2 % (ref 2.3–15.9)
RBC.: 5.34 MIL/uL — ABNORMAL HIGH (ref 3.87–5.11)
Retic Count, Absolute: 84.9 10*3/uL (ref 19.0–186.0)
Retic Ct Pct: 1.6 % (ref 0.4–3.1)

## 2023-06-02 NOTE — Patient Instructions (Addendum)
You were seen and examined today by Dr. Ellin Saba. Dr. Ellin Saba is a hematologist, meaning that he specializes in blood abnormalities. Dr. Ellin Saba discussed your past medical history, family history of cancers/blood conditions and the events that led to you being here today.  You were referred to Dr. Ellin Saba due to your most recent lab work showing erythrocytosis (elevated red blood cells).  Dr. Ellin Saba has recommended additional labs today for further evaluation.  Follow-up as scheduled.

## 2023-06-03 LAB — ERYTHROPOIETIN: Erythropoietin: 6.5 m[IU]/mL (ref 2.6–18.5)

## 2023-06-05 DIAGNOSIS — G8929 Other chronic pain: Secondary | ICD-10-CM | POA: Diagnosis not present

## 2023-06-05 DIAGNOSIS — M7552 Bursitis of left shoulder: Secondary | ICD-10-CM | POA: Diagnosis not present

## 2023-06-05 DIAGNOSIS — M25512 Pain in left shoulder: Secondary | ICD-10-CM | POA: Diagnosis not present

## 2023-06-05 DIAGNOSIS — M7542 Impingement syndrome of left shoulder: Secondary | ICD-10-CM | POA: Diagnosis not present

## 2023-06-14 LAB — JAK2 V617F RFX CALR/MPL/E12-15

## 2023-06-14 LAB — CALR +MPL + E12-E15  (REFLEX)

## 2023-06-21 ENCOUNTER — Encounter: Payer: Self-pay | Admitting: Oncology

## 2023-06-26 DIAGNOSIS — Z1151 Encounter for screening for human papillomavirus (HPV): Secondary | ICD-10-CM | POA: Diagnosis not present

## 2023-06-26 DIAGNOSIS — Z01419 Encounter for gynecological examination (general) (routine) without abnormal findings: Secondary | ICD-10-CM | POA: Diagnosis not present

## 2023-06-26 DIAGNOSIS — Z124 Encounter for screening for malignant neoplasm of cervix: Secondary | ICD-10-CM | POA: Diagnosis not present

## 2023-06-28 ENCOUNTER — Inpatient Hospital Stay: Payer: BC Managed Care – PPO | Admitting: Oncology

## 2023-07-05 ENCOUNTER — Ambulatory Visit: Payer: BC Managed Care – PPO | Admitting: Family Medicine

## 2023-07-13 ENCOUNTER — Telehealth: Payer: Self-pay | Admitting: Nurse Practitioner

## 2023-07-13 ENCOUNTER — Encounter: Payer: Self-pay | Admitting: Nurse Practitioner

## 2023-07-13 DIAGNOSIS — E669 Obesity, unspecified: Secondary | ICD-10-CM

## 2023-07-13 MED ORDER — ZEPBOUND 5 MG/0.5ML ~~LOC~~ SOAJ: 5.0000 mg | SUBCUTANEOUS | 6 refills | Status: DC

## 2023-07-13 NOTE — Telephone Encounter (Signed)
Per patient please initiate prior authorization via [employer] for refill on her tirzepatide 5 mg/week injection for treatment of obesity.

## 2023-07-14 ENCOUNTER — Other Ambulatory Visit: Payer: Self-pay | Admitting: Nurse Practitioner

## 2023-07-14 ENCOUNTER — Other Ambulatory Visit (HOSPITAL_COMMUNITY): Payer: Self-pay

## 2023-07-14 DIAGNOSIS — E669 Obesity, unspecified: Secondary | ICD-10-CM

## 2023-07-14 MED ORDER — ZEPBOUND 5 MG/0.5ML ~~LOC~~ SOAJ
5.0000 mg | SUBCUTANEOUS | 3 refills | Status: DC
Start: 2023-07-14 — End: 2024-02-28

## 2023-07-14 NOTE — Telephone Encounter (Signed)
Pharmacy Patient Advocate Encounter   Received notification from Pt Calls Messages that prior authorization for Zepbound 5MG /0.5ML pen-injectors is required/requested.   Insurance verification completed.   The patient is insured through [facility] .   Per test claim: Refill too soon. PA is not needed at this time. Medication was filled 05/29/23. Next eligible fill date is 07/30/23.

## 2023-07-19 ENCOUNTER — Other Ambulatory Visit (HOSPITAL_COMMUNITY): Payer: Self-pay

## 2023-07-27 ENCOUNTER — Encounter (INDEPENDENT_AMBULATORY_CARE_PROVIDER_SITE_OTHER): Payer: Self-pay

## 2023-07-28 ENCOUNTER — Telehealth: Payer: Self-pay

## 2023-07-28 ENCOUNTER — Other Ambulatory Visit (HOSPITAL_COMMUNITY): Payer: Self-pay

## 2023-07-28 NOTE — Telephone Encounter (Signed)
PA for Zepbound request send to PA team

## 2023-07-28 NOTE — Telephone Encounter (Signed)
Pharmacy Patient Advocate Encounter   Received notification from Pt Calls Messages that prior authorization for Zepbound 5mg /0.41ml is required/requested.   Insurance verification completed.   The patient is insured through [employer] .   Per test claim: PA required; PA started via CoverMyMeds. KEY [id number] . Waiting for clinical questions to populate.

## 2023-07-28 NOTE — Telephone Encounter (Signed)
Pharmacy Patient Advocate Encounter  Received notification from [facility] that Prior Authorization for Zepbound 5mg /0.74ml has been APPROVED from 06/28/23 to 03/24/24   PA #/Case ID/Reference #: [id number]

## 2023-07-28 NOTE — Telephone Encounter (Signed)
Pt needs PA for Zepbound

## 2023-08-10 DIAGNOSIS — S338XXA Sprain of other parts of lumbar spine and pelvis, initial encounter: Secondary | ICD-10-CM | POA: Diagnosis not present

## 2023-08-10 DIAGNOSIS — S134XXA Sprain of ligaments of cervical spine, initial encounter: Secondary | ICD-10-CM | POA: Diagnosis not present

## 2023-08-10 DIAGNOSIS — S233XXA Sprain of ligaments of thoracic spine, initial encounter: Secondary | ICD-10-CM | POA: Diagnosis not present

## 2023-09-01 ENCOUNTER — Ambulatory Visit: Payer: BC Managed Care – PPO | Admitting: Nurse Practitioner

## 2023-09-06 ENCOUNTER — Other Ambulatory Visit: Payer: Self-pay | Admitting: Nurse Practitioner

## 2023-09-08 ENCOUNTER — Other Ambulatory Visit: Payer: Self-pay | Admitting: Nurse Practitioner

## 2023-09-08 MED ORDER — HYDROCHLOROTHIAZIDE 12.5 MG PO CAPS: 12.5000 mg | ORAL_CAPSULE | Freq: Every day | ORAL | 0 refills | Status: DC

## 2023-09-14 DIAGNOSIS — R87612 Low grade squamous intraepithelial lesion on cytologic smear of cervix (LGSIL): Secondary | ICD-10-CM | POA: Diagnosis not present

## 2023-09-14 DIAGNOSIS — N870 Mild cervical dysplasia: Secondary | ICD-10-CM | POA: Diagnosis not present

## 2023-09-21 ENCOUNTER — Ambulatory Visit: Payer: BC Managed Care – PPO | Admitting: Nurse Practitioner

## 2023-09-21 VITALS — BP 124/80 | HR 77 | Temp 97.9°F | Ht 64.0 in | Wt 166.8 lb

## 2023-09-21 DIAGNOSIS — E876 Hypokalemia: Secondary | ICD-10-CM | POA: Diagnosis not present

## 2023-09-21 DIAGNOSIS — Z6834 Body mass index (BMI) 34.0-34.9, adult: Secondary | ICD-10-CM

## 2023-09-21 DIAGNOSIS — J310 Chronic rhinitis: Secondary | ICD-10-CM | POA: Insufficient documentation

## 2023-09-21 DIAGNOSIS — E66811 Obesity, class 1: Secondary | ICD-10-CM

## 2023-09-21 LAB — MAGNESIUM: Magnesium: 2.2 mg/dL (ref 1.5–2.5)

## 2023-09-21 LAB — BASIC METABOLIC PANEL
BUN: 10 mg/dL (ref 6–23)
CO2: 29 meq/L (ref 19–32)
Calcium: 9.8 mg/dL (ref 8.4–10.5)
Chloride: 102 meq/L (ref 96–112)
Creatinine, Ser: 0.78 mg/dL (ref 0.40–1.20)
GFR: 91.29 mL/min (ref >60.00)
Glucose, Bld: 87 mg/dL (ref 70–99)
Potassium: 3.6 meq/L (ref 3.5–5.1)
Sodium: 138 meq/L (ref 135–145)

## 2023-09-21 MED ORDER — LEVOCETIRIZINE DIHYDROCHLORIDE 5 MG PO TABS
5.0000 mg | ORAL_TABLET | Freq: Every evening | ORAL | 1 refills | Status: DC
Start: 2023-09-21 — End: 2024-03-21

## 2023-09-21 MED ORDER — MONTELUKAST SODIUM 10 MG PO TABS
10.0000 mg | ORAL_TABLET | Freq: Every day | ORAL | 1 refills | Status: DC
Start: 2023-09-21 — End: 2024-10-23

## 2023-09-21 NOTE — Progress Notes (Signed)
Established Patient Office Visit  Subjective   Patient ID: Nichole Zamora, female    DOB: 08/15/1976  Age: 47 y.o. MRN: [id number]  Chief Complaint  Patient presents with   Follow-up    19month f/u. ENT put her on montelukast and wants to know if she can be prescribe it today     Allergic Rhinitis: Chronic especially in fall season.  Reports that she has been treated with montelukast in the past.  Per chart review I do see where this was prescribed in the past as well as Xyzal.  Patient currently on fexofenadine but reports that her seasonal allergies are not being controlled well on this alone.  Obesity: Starting BMI 34.49, starting weight 194 pounds.  Currently on Zepbound 5 mg/week.  Current weight 166 pounds with BMI of 28.63.  Hypokalemia: Incidental finding of potassium of 3.3, due to have labs rechecked today.    ROS: see HPI    Objective:     BP 124/80 (BP Location: Left Arm, Patient Position: Sitting, Cuff Size: Normal)   Pulse 77   Temp 97.9 F (36.6 C) (Oral)   Ht 5\' 4"  (1.626 m)   Wt 166 lb 12.8 oz (75.7 kg)   SpO2 99%   BMI 28.63 kg/m  BP Readings from Last 3 Encounters:  09/21/23 124/80  06/02/23 125/62  06/01/23 118/80   Wt Readings from Last 3 Encounters:  09/21/23 166 lb 12.8 oz (75.7 kg)  06/02/23 173 lb 3.2 oz (78.6 kg)  06/01/23 173 lb 6.4 oz (78.7 kg)      Physical Exam Vitals reviewed.  Constitutional:      General: She is not in acute distress.    Appearance: Normal appearance.  HENT:     Head: Normocephalic and atraumatic.  Cardiovascular:     Rate and Rhythm: Normal rate and regular rhythm.     Pulses: Normal pulses.     Heart sounds: Normal heart sounds.  Pulmonary:     Effort: Pulmonary effort is normal.     Breath sounds: Normal breath sounds.  Skin:    General: Skin is warm and dry.  Neurological:     General: No focal deficit present.     Mental Status: She is alert and oriented to person, place, and time.   Psychiatric:        Mood and Affect: Mood normal.        Behavior: Behavior normal.        Judgment: Judgment normal.      No results found for any visits on 09/21/23.    The 10-year ASCVD risk score (Arnett DK, et al., 2019) is: 0.8%    Assessment & Plan:   Problem List Items Addressed This Visit       Respiratory   Chronic rhinitis - Primary    Chronic, intermittent associated with seasonal allergies especially in the fall. Agreeable to prescribing montelukast 10 mg a mouth daily.  Will change from fexofenadine to Xyzal 5 mg tablet at bedtime.  Patient warned of possible sedation with Xyzal.      Relevant Medications   montelukast (SINGULAIR) 10 MG tablet   levocetirizine (XYZAL) 5 MG tablet     Other   Hypokalemia    Check BMP and magnesium level, further recommendations may be made based upon those results.      Relevant Orders   Basic metabolic panel   Magnesium   Class 1 obesity with serious comorbidity and body mass index (BMI) of  34.0 to 34.9 in adult    Chronic Patient tolerating Zepbound well.  Has lost about 28 pounds so far.  Patient encouraged to continue Zepbound 5 mg/inj.       Return in about 6 months (around 03/21/2024) for F/U with Maralyn Sago.    [name], NP

## 2023-09-21 NOTE — Assessment & Plan Note (Signed)
Chronic, intermittent associated with seasonal allergies especially in the fall. Agreeable to prescribing montelukast 10 mg a mouth daily.  Will change from fexofenadine to Xyzal 5 mg tablet at bedtime.  Patient warned of possible sedation with Xyzal.

## 2023-09-21 NOTE — Assessment & Plan Note (Signed)
Check BMP and magnesium level, further recommendations may be made based upon those results.

## 2023-09-21 NOTE — Assessment & Plan Note (Signed)
Chronic Patient tolerating Zepbound well.  Has lost about 28 pounds so far.  Patient encouraged to continue Zepbound 5 mg/inj.

## 2023-11-23 ENCOUNTER — Other Ambulatory Visit: Payer: Self-pay | Admitting: Nurse Practitioner

## 2023-12-27 DIAGNOSIS — S338XXA Sprain of other parts of lumbar spine and pelvis, initial encounter: Secondary | ICD-10-CM | POA: Diagnosis not present

## 2023-12-27 DIAGNOSIS — S233XXA Sprain of ligaments of thoracic spine, initial encounter: Secondary | ICD-10-CM | POA: Diagnosis not present

## 2023-12-27 DIAGNOSIS — S134XXA Sprain of ligaments of cervical spine, initial encounter: Secondary | ICD-10-CM | POA: Diagnosis not present

## 2023-12-28 ENCOUNTER — Encounter: Payer: BC Managed Care – PPO | Admitting: Obstetrics & Gynecology

## 2024-01-04 DIAGNOSIS — L821 Other seborrheic keratosis: Secondary | ICD-10-CM | POA: Diagnosis not present

## 2024-01-04 DIAGNOSIS — L814 Other melanin hyperpigmentation: Secondary | ICD-10-CM | POA: Diagnosis not present

## 2024-01-04 DIAGNOSIS — L720 Epidermal cyst: Secondary | ICD-10-CM | POA: Diagnosis not present

## 2024-01-04 DIAGNOSIS — D225 Melanocytic nevi of trunk: Secondary | ICD-10-CM | POA: Diagnosis not present

## 2024-01-24 ENCOUNTER — Encounter: Payer: Self-pay | Admitting: Obstetrics & Gynecology

## 2024-01-24 ENCOUNTER — Ambulatory Visit: Payer: BC Managed Care – PPO | Admitting: Obstetrics & Gynecology

## 2024-01-24 VITALS — BP 128/87 | HR 66 | Ht 64.0 in | Wt 163.6 lb

## 2024-01-24 DIAGNOSIS — G4709 Other insomnia: Secondary | ICD-10-CM

## 2024-01-24 DIAGNOSIS — N959 Unspecified menopausal and perimenopausal disorder: Secondary | ICD-10-CM

## 2024-01-24 DIAGNOSIS — Z87410 Personal history of cervical dysplasia: Secondary | ICD-10-CM

## 2024-01-24 MED ORDER — PROGESTERONE 200 MG PO CAPS
200.0000 mg | ORAL_CAPSULE | Freq: Every day | ORAL | 4 refills | Status: DC
Start: 2024-01-24 — End: 2024-06-26

## 2024-01-24 NOTE — Progress Notes (Signed)
GYN VISIT Patient name: Nichole Zamora MRN [id number]  Date of birth: 01/31/1976 Chief Complaint:   Establish Care  History of Present Illness:   Nichole Zamora is a 48 y.o. G84P1001 female being seen today for the following concerns:  -Previously seen at [facility] second opinion regarding perimenopausal status.  Per patient she was told that her option was to be on a pill which she does not want to do.  She notes that she had been on a pill for a very long time and did not like the way it made her feel  Menses are started to be irregular- missed menses Dec/Jan. Then had a period 10 days late from expected time frame.  Previously in Nov had a period 2 weeks apart.  These changes have been noted over the past 2 years.  Denies heavy menstrual bleeding or significant dysmenorrhea  Previously on COCs (and tubal ligation)  @ 41 went off COCs  While she does not have night sweats or hot flashes.  She notes significant irritability and lack of sleep.  She thinks is mostly the lack of sleep that is then causing her irritability.  She was given Prometrium to take from day 21-30, which does help her sleep.  She has not tried any herbal supplements to help with her symptoms.  Not currently sexually active  Currently rates her symptoms: 7/10  History of cervical dysplasia: LSIL, HPV neg (06/2023) > colpo CIN 1  No LMP recorded. Patient is perimenopausal.  Contraception: tubal ligation   Review of Systems:   Pertinent items are noted in HPI Denies fever/chills, dizziness, headaches, visual disturbances, fatigue, shortness of breath, chest pain, abdominal pain, vomiting. Pertinent History Reviewed:   Past Surgical History:  Procedure Laterality Date   LAMINECTOMY  2012   SEPTOPLASTY     TUBAL LIGATION  2004    Past Medical History:  Diagnosis Date   Essential hypertension, benign 11/12/2019   Sullivan Lone syndrome    HLD (hyperlipidemia) 11/12/2019   Obesity (BMI  30.0-34.9) 11/12/2019   PMS (premenstrual syndrome) 11/12/2019   Thyroid disease    Vitamin D deficiency disease 11/12/2019   Reviewed problem list, medications and allergies. Physical Assessment:   Vitals:   01/24/24 1511  BP: 128/87  Pulse: 66  Weight: 163 lb 9.6 oz (74.2 kg)  Height: 5\' 4"  (1.626 m)  Body mass index is 28.08 kg/m.       Physical Examination:   General appearance: alert, well appearing, and in no distress  Psych: mood appropriate, normal affect  Skin: warm & dry   Cardiovascular: normal heart rate noted  Respiratory: normal respiratory effort, no distress  Extremities: no edema   Chaperone: N/A    Assessment & Plan:  1) Perimenopausal symptoms -Reviewed definition of perimenopause versus menopause.  Reviewed all potential symptoms and discussed with patient that presentation and management varies from patient to patient -Discussed herbal supplements specifically magnesium in light of her main concern being insomnia -Reviewed risk/benefits of hormone replacement therapy.  Also discussed SSRI such as lexapro or paxil -After much discussion regarding her concerns, plan was made to try herbal supplements as well as continue Prometrium -Okay to take Prometrium daily if needed may also try to decrease to 100 mg dose if desired -Will plan to also monitor bleeding pattern  2) h/o cervical dysplasia -Reviewed prior Pap smear -Discussed her concerns regarding hormonal changes and its potential impact on these results -Reviewed ASCCP guidelines and will manage according  to both guidelines and what patient feels is best for her medical management -Briefly discussed vaginal estrogen therapy pending her future Pap smears  Jorde of today's visit was spent in consultation reviewing all of the above information   Meds ordered this encounter  Medications   progesterone (PROMETRIUM) 200 MG capsule    Sig: Take 1 capsule (200 mg total) by mouth daily.    Dispense:  90  capsule    Refill:  4      Return for July 2025 annual.   Myna Hidalgo, DO Attending Obstetrician & Gynecologist, [facility], [facility]

## 2024-02-21 DIAGNOSIS — S338XXA Sprain of other parts of lumbar spine and pelvis, initial encounter: Secondary | ICD-10-CM | POA: Diagnosis not present

## 2024-02-21 DIAGNOSIS — S233XXA Sprain of ligaments of thoracic spine, initial encounter: Secondary | ICD-10-CM | POA: Diagnosis not present

## 2024-02-21 DIAGNOSIS — S134XXA Sprain of ligaments of cervical spine, initial encounter: Secondary | ICD-10-CM | POA: Diagnosis not present

## 2024-02-27 ENCOUNTER — Encounter: Payer: Self-pay | Admitting: Nurse Practitioner

## 2024-02-28 ENCOUNTER — Other Ambulatory Visit: Payer: Self-pay | Admitting: Nurse Practitioner

## 2024-02-28 DIAGNOSIS — Z6834 Body mass index (BMI) 34.0-34.9, adult: Secondary | ICD-10-CM

## 2024-02-28 MED ORDER — ZEPBOUND 7.5 MG/0.5ML ~~LOC~~ SOAJ
7.5000 mg | SUBCUTANEOUS | 1 refills | Status: DC
Start: 2024-02-28 — End: 2024-04-12

## 2024-03-04 ENCOUNTER — Telehealth: Payer: Self-pay

## 2024-03-04 ENCOUNTER — Other Ambulatory Visit (HOSPITAL_COMMUNITY): Payer: Self-pay

## 2024-03-04 NOTE — Telephone Encounter (Signed)
 Clinical questions answered and PA submitted.

## 2024-03-04 NOTE — Telephone Encounter (Signed)
 Pharmacy Patient Advocate Encounter  Received notification from [facility] that Prior Authorization for Zepbound 5mg /0.38ml has been APPROVED from 02/03/24 to 03/04/25   PA #/Case ID/Reference #: [id number]

## 2024-03-04 NOTE — Telephone Encounter (Signed)
 Pharmacy Patient Advocate Encounter   Received notification from Patient Advice Request messages that prior authorization for Zepbound 5MG /0.5ML pen-injectors is required/requested.   Insurance verification completed.   The patient is insured through [employer] .   Per test claim: PA required; PA started via CoverMyMeds. KEY  [id number] . Waiting for clinical questions to populate.

## 2024-03-06 DIAGNOSIS — D225 Melanocytic nevi of trunk: Secondary | ICD-10-CM | POA: Diagnosis not present

## 2024-03-06 DIAGNOSIS — L821 Other seborrheic keratosis: Secondary | ICD-10-CM | POA: Diagnosis not present

## 2024-03-06 DIAGNOSIS — L720 Epidermal cyst: Secondary | ICD-10-CM | POA: Diagnosis not present

## 2024-03-06 DIAGNOSIS — L570 Actinic keratosis: Secondary | ICD-10-CM | POA: Diagnosis not present

## 2024-03-06 DIAGNOSIS — L814 Other melanin hyperpigmentation: Secondary | ICD-10-CM | POA: Diagnosis not present

## 2024-03-21 ENCOUNTER — Ambulatory Visit: Payer: BC Managed Care – PPO | Admitting: Nurse Practitioner

## 2024-03-21 VITALS — BP 112/72 | HR 70 | Temp 97.8°F | Ht 64.0 in | Wt 160.2 lb

## 2024-03-21 DIAGNOSIS — J310 Chronic rhinitis: Secondary | ICD-10-CM | POA: Diagnosis not present

## 2024-03-21 DIAGNOSIS — Z6827 Body mass index (BMI) 27.0-27.9, adult: Secondary | ICD-10-CM

## 2024-03-21 DIAGNOSIS — E66811 Obesity, class 1: Secondary | ICD-10-CM | POA: Diagnosis not present

## 2024-03-21 NOTE — Assessment & Plan Note (Signed)
 Chronic Starting weight 194 pounds, starting BMI 34.49 Current weight 160 pounds, current BMI 27.51 Tolerating Zepbound 7.5 mg/week well.  Continue on current dose.

## 2024-03-21 NOTE — Progress Notes (Signed)
   Established Patient Office Visit  Subjective   Patient ID: Nichole Zamora, female    DOB: 04-10-1976  Age: 48 y.o. MRN: [id number]  Chief Complaint  Patient presents with   Allergic Rhinitis     Allergic rhinitis: Currently on montelukast 10 mg daily and fexofenadine 180 mg daily.  She is tolerating medications well.  Is managing her allergic rhinitis well at this time. Obesity: Starting weight 194 pounds, starting BMI 34.49.  Currently on Zepbound 7.5 mg/week.  Tolerating well.  No nausea, vomiting, abdominal pain, dysphagia, pressure, or mass to throat.  Current weight 160 pounds, current BMI 27.51.    ROS: see HPI    Objective:     BP 112/72   Pulse 70   Temp 97.8 F (36.6 C) (Temporal)   Ht 5\' 4"  (1.626 m)   Wt 160 lb 4 oz (72.7 kg)   SpO2 97%   BMI 27.51 kg/m  BP Readings from Last 3 Encounters:  03/21/24 112/72  01/24/24 128/87  09/21/23 124/80   Wt Readings from Last 3 Encounters:  03/21/24 160 lb 4 oz (72.7 kg)  01/24/24 163 lb 9.6 oz (74.2 kg)  09/21/23 166 lb 12.8 oz (75.7 kg)      Physical Exam Vitals reviewed.  Constitutional:      General: She is not in acute distress.    Appearance: Normal appearance.  HENT:     Head: Normocephalic and atraumatic.  Cardiovascular:     Rate and Rhythm: Normal rate and regular rhythm.     Pulses: Normal pulses.     Heart sounds: Normal heart sounds.  Pulmonary:     Effort: Pulmonary effort is normal.     Breath sounds: Normal breath sounds.  Skin:    General: Skin is warm and dry.  Neurological:     General: No focal deficit present.     Mental Status: She is alert and oriented to person, place, and time.  Psychiatric:        Mood and Affect: Mood normal.        Behavior: Behavior normal.        Judgment: Judgment normal.      No results found for any visits on 03/21/24.    The 10-year ASCVD risk score (Arnett DK, et al., 2019) is: 0.7%    Assessment & Plan:   Problem List Items Addressed  This Visit       Respiratory   Chronic rhinitis   Chronic, stable, controlled Continue fexofenadine 180 mg daily and montelukast 10 mg daily.        Other   Obesity (BMI 30.0-34.9) - Primary   Chronic Starting weight 194 pounds, starting BMI 34.49 Current weight 160 pounds, current BMI 27.51 Tolerating Zepbound 7.5 mg/week well.  Continue on current dose.       Return in about 2 months (around 05/21/2024) for CPE with Maralyn Sago.    [name], NP

## 2024-03-21 NOTE — Assessment & Plan Note (Signed)
 Chronic, stable, controlled Continue fexofenadine 180 mg daily and montelukast 10 mg daily.

## 2024-03-30 ENCOUNTER — Other Ambulatory Visit: Payer: Self-pay | Admitting: Nurse Practitioner

## 2024-03-30 DIAGNOSIS — K219 Gastro-esophageal reflux disease without esophagitis: Secondary | ICD-10-CM

## 2024-04-08 DIAGNOSIS — R92323 Mammographic fibroglandular density, bilateral breasts: Secondary | ICD-10-CM | POA: Diagnosis not present

## 2024-04-08 DIAGNOSIS — Z1231 Encounter for screening mammogram for malignant neoplasm of breast: Secondary | ICD-10-CM | POA: Diagnosis not present

## 2024-04-08 LAB — HM MAMMOGRAPHY

## 2024-04-12 ENCOUNTER — Encounter: Payer: Self-pay | Admitting: Nurse Practitioner

## 2024-04-12 ENCOUNTER — Other Ambulatory Visit: Payer: Self-pay | Admitting: Nurse Practitioner

## 2024-04-12 DIAGNOSIS — E66811 Obesity, class 1: Secondary | ICD-10-CM

## 2024-04-12 MED ORDER — ZEPBOUND 5 MG/0.5ML ~~LOC~~ SOAJ: 5.0000 mg | SUBCUTANEOUS | 3 refills | Status: DC

## 2024-04-22 ENCOUNTER — Other Ambulatory Visit: Payer: Self-pay | Admitting: Nurse Practitioner

## 2024-04-22 DIAGNOSIS — E66811 Obesity, class 1: Secondary | ICD-10-CM

## 2024-04-22 MED ORDER — ZEPBOUND 5 MG/0.5ML ~~LOC~~ SOAJ: 5.0000 mg | SUBCUTANEOUS | 3 refills | Status: DC

## 2024-04-25 DIAGNOSIS — S134XXA Sprain of ligaments of cervical spine, initial encounter: Secondary | ICD-10-CM | POA: Diagnosis not present

## 2024-04-25 DIAGNOSIS — S233XXA Sprain of ligaments of thoracic spine, initial encounter: Secondary | ICD-10-CM | POA: Diagnosis not present

## 2024-04-25 DIAGNOSIS — S338XXA Sprain of other parts of lumbar spine and pelvis, initial encounter: Secondary | ICD-10-CM | POA: Diagnosis not present

## 2024-04-26 ENCOUNTER — Other Ambulatory Visit (HOSPITAL_COMMUNITY): Payer: Self-pay

## 2024-04-26 ENCOUNTER — Telehealth: Payer: Self-pay

## 2024-04-26 DIAGNOSIS — J069 Acute upper respiratory infection, unspecified: Secondary | ICD-10-CM | POA: Diagnosis not present

## 2024-04-26 NOTE — Telephone Encounter (Signed)
 Pharmacy Patient Advocate Encounter   Received notification from Patient Advice Request messages that prior authorization for Zepbound  5mg /0.25ml is required/requested.   Insurance verification completed.   The patient is insured through [employer] .   Per test claim: Refill too soon. PA is not needed at this time. Medication was filled 04/19/24. Next eligible fill date is 05/09/24.   Prior authorization on file is good until 03/04/25. Per message from patient, looks like the patient is out of refills on her Zepbound .

## 2024-05-09 ENCOUNTER — Encounter: Payer: Self-pay | Admitting: Nurse Practitioner

## 2024-05-10 ENCOUNTER — Other Ambulatory Visit: Payer: Self-pay | Admitting: Medical Genetics

## 2024-05-13 ENCOUNTER — Other Ambulatory Visit (HOSPITAL_COMMUNITY)
Admission: RE | Admit: 2024-05-13 | Discharge: 2024-05-13 | Disposition: A | Discharge status: Home / Self Care | Payer: Self-pay | Source: Ambulatory Visit | Attending: Oncology | Admitting: Oncology

## 2024-05-16 ENCOUNTER — Encounter: Payer: Self-pay | Admitting: Internal Medicine

## 2024-05-16 NOTE — Patient Instructions (Addendum)
 Tetanus vaccine today.   Blood work was ordered.       Medications changes include :   None     Return in about 6 months (around 11/16/2024) for follow up with PCP.   Health Maintenance, Female Adopting a healthy lifestyle and getting preventive care are important in promoting health and wellness. Ask your health care provider about: The right schedule for you to have regular tests and exams. Things you can do on your own to prevent diseases and keep yourself healthy. What should I know about diet, weight, and exercise? Eat a healthy diet  Eat a diet that includes plenty of vegetables, fruits, low-fat dairy products, and lean protein. Do not eat a lot of foods that are high in solid fats, added sugars, or sodium. Maintain a healthy weight Body mass index (BMI) is used to identify weight problems. It estimates body fat based on height and weight. Your health care provider can help determine your BMI and help you achieve or maintain a healthy weight. Get regular exercise Get regular exercise. This is one of the most important things you can do for your health. Most adults should: Exercise for at least 150 minutes each week. The exercise should increase your heart rate and make you sweat (moderate-intensity exercise). Do strengthening exercises at least twice a week. This is in addition to the moderate-intensity exercise. Spend less time sitting. Even light physical activity can be beneficial. Watch cholesterol and blood lipids Have your blood tested for lipids and cholesterol at 47 years of age, then have this test every 5 years. Have your cholesterol levels checked more often if: Your lipid or cholesterol levels are high. You are older than 47 years of age. You are at high risk for heart disease. What should I know about cancer screening? Depending on your health history and family history, you may need to have cancer screening at various ages. This may include screening  for: Breast cancer. Cervical cancer. Colorectal cancer. Skin cancer. Lung cancer. What should I know about heart disease, diabetes, and high blood pressure? Blood pressure and heart disease High blood pressure causes heart disease and increases the risk of stroke. This is more likely to develop in people who have high blood pressure readings or are overweight. Have your blood pressure checked: Every 3-5 years if you are 68-23 years of age. Every year if you are 40 years old or older. Diabetes Have regular diabetes screenings. This checks your fasting blood sugar level. Have the screening done: Once every three years after age 62 if you are at a normal weight and have a low risk for diabetes. More often and at a younger age if you are overweight or have a high risk for diabetes. What should I know about preventing infection? Hepatitis B If you have a higher risk for hepatitis B, you should be screened for this virus. Talk with your health care provider to find out if you are at risk for hepatitis B infection. Hepatitis C Testing is recommended for: Everyone born from [birth year] through 1964. Anyone with known risk factors for hepatitis C. Sexually transmitted infections (STIs) Get screened for STIs, including gonorrhea and chlamydia, if: You are sexually active and are younger than 47 years of age. You are older than 47 years of age and your health care provider tells you that you are at risk for this type of infection. Your sexual activity has changed since you were last screened, and you are at increased  risk for chlamydia or gonorrhea. Ask your health care provider if you are at risk. Ask your health care provider about whether you are at high risk for HIV. Your health care provider may recommend a prescription medicine to help prevent HIV infection. If you choose to take medicine to prevent HIV, you should first get tested for HIV. You should then be tested every 3 months for as long as you  are taking the medicine. Pregnancy If you are about to stop having your period (premenopausal) and you may become pregnant, seek counseling before you get pregnant. Take 400 to 800 micrograms (mcg) of folic acid every day if you become pregnant. Ask for birth control (contraception) if you want to prevent pregnancy. Osteoporosis and menopause Osteoporosis is a disease in which the bones lose minerals and strength with aging. This can result in bone fractures. If you are 65 years old or older, or if you are at risk for osteoporosis and fractures, ask your health care provider if you should: Be screened for bone loss. Take a calcium or vitamin D  supplement to lower your risk of fractures. Be given hormone replacement therapy (HRT) to treat symptoms of menopause. Follow these instructions at home: Alcohol use Do not drink alcohol if: Your health care provider tells you not to drink. You are pregnant, may be pregnant, or are planning to become pregnant. If you drink alcohol: Limit how much you have to: 0-1 drink a day. Know how much alcohol is in your drink. In the [place] one drink equals one 12 oz bottle of beer (355 mL), one 5 oz glass of wine (148 mL), or one 1 oz glass of hard liquor (44 mL). Lifestyle Do not use any products that contain nicotine or tobacco. These products include cigarettes, chewing tobacco, and vaping devices, such as e-cigarettes. If you need help quitting, ask your health care provider. Do not use street drugs. Do not share needles. Ask your health care provider for help if you need support or information about quitting drugs. General instructions Schedule regular health, dental, and eye exams. Stay current with your vaccines. Tell your health care provider if: You often feel depressed. You have ever been abused or do not feel safe at home. Summary Adopting a healthy lifestyle and getting preventive care are important in promoting health and wellness. Follow your  health care provider's instructions about healthy diet, exercising, and getting tested or screened for diseases. Follow your health care provider's instructions on monitoring your cholesterol and blood pressure. This information is not intended to replace advice given to you by your health care provider. Make sure you discuss any questions you have with your health care provider. Document Revised: 04/19/2021 Document Reviewed: 04/19/2021 Elsevier Patient Education  2024 [employer].

## 2024-05-16 NOTE — Progress Notes (Unsigned)
 Subjective:    Patient ID: Nichole Zamora, female    DOB: 1976-08-28, 48 y.o.   MRN: [id number]      HPI Nichole Zamora is here for a Physical exam and her chronic medical problems.    No concerns-she has a form for work.  Medications and allergies reviewed with patient and updated if appropriate.  Current Outpatient Medications on File Prior to Visit  Medication Sig Dispense Refill   Bacillus Coagulans-Inulin (PROBIOTIC) 1-250 BILLION-MG CAPS Take 1 capsule by mouth daily.     Cholecalciferol (VITAMIN D -3) 125 MCG (5000 UT) TABS Take 1 tablet by mouth daily.      Cobalamin Combinations (VITAMIN B12-FOLIC ACID PO) Take 1 tablet by mouth daily.     famotidine  (PEPCID ) 40 MG tablet TAKE 1 TABLET BY MOUTH ONCE DAILY. 90 tablet 0   fexofenadine (ALLEGRA) 180 MG tablet Take 180 mg by mouth daily.     hydrochlorothiazide  (MICROZIDE ) 12.5 MG capsule TAKE 1 CAPSULE(12.5 MG) BY MOUTH DAILY 90 capsule 3   Magnesium 500 MG CAPS Take 1 capsule by mouth daily.     montelukast  (SINGULAIR ) 10 MG tablet Take 1 tablet (10 mg total) by mouth at bedtime. 90 tablet 1   OVER THE COUNTER MEDICATION Take 1 tablet by mouth daily. estrovite     Potassium 99 MG TABS Take 1 tablet by mouth daily.     progesterone  (PROMETRIUM ) 200 MG capsule Take 1 capsule (200 mg total) by mouth daily. 90 capsule 4   No current facility-administered medications on file prior to visit.    Review of Systems  Constitutional:  Negative for fever.  Eyes:  Negative for visual disturbance.  Respiratory:  Negative for cough, shortness of breath and wheezing.   Cardiovascular:  Negative for chest pain, palpitations and leg swelling.  Gastrointestinal:  Negative for abdominal pain, blood in stool, constipation and diarrhea.       No gerd  Genitourinary:  Negative for dysuria.  Musculoskeletal:  Negative for arthralgias and back pain.  Skin:  Negative for rash.  Neurological:  Negative for light-headedness, numbness and  headaches.  Psychiatric/Behavioral:  Negative for dysphoric mood and sleep disturbance. The patient is not nervous/anxious.        Objective:   Vitals:   05/17/24 0822  BP: 116/76  Pulse: 78  Temp: 98.1 F (36.7 C)  SpO2: 98%   Filed Weights   05/17/24 0822  Weight: 164 lb (74.4 kg)   Body mass index is 28.15 kg/m.  BP Readings from Last 3 Encounters:  05/17/24 116/76  03/21/24 112/72  01/24/24 128/87    Wt Readings from Last 3 Encounters:  05/17/24 164 lb (74.4 kg)  03/21/24 160 lb 4 oz (72.7 kg)  01/24/24 163 lb 9.6 oz (74.2 kg)       Physical Exam Constitutional: She appears well-developed and well-nourished. No distress.  HENT:  Head: Normocephalic and atraumatic.  Right Ear: External ear normal. Normal ear canal and TM Left Ear: External ear normal.  Normal ear canal and TM Mouth/Throat: Oropharynx is clear and moist.  Eyes: Conjunctivae normal.  Neck: Neck supple. No tracheal deviation present. No thyromegaly present.  No carotid bruit  Cardiovascular: Normal rate, regular rhythm and normal heart sounds.   No murmur heard.  No edema. Pulmonary/Chest: Effort normal and breath sounds normal. No respiratory distress. She has no wheezes. She has no rales.  Breast: deferred   Abdominal: Soft. She exhibits no distension. There is no tenderness.  Lymphadenopathy:  She has no cervical adenopathy.  Skin: Skin is warm and dry. She is not diaphoretic.  Psychiatric: She has a normal mood and affect. Her behavior is normal.     Lab Results  Component Value Date   WBC 8.0 06/02/2023   HGB 15.2 (H) 06/02/2023   HCT 45.5 06/02/2023   PLT 244 06/02/2023   GLUCOSE 87 09/21/2023   CHOL 163 05/10/2023   TRIG 75.0 05/10/2023   HDL 55.10 05/10/2023   LDLCALC 93 05/10/2023   ALT 7 05/10/2023   AST 13 05/10/2023   NA 138 09/21/2023   K 3.6 09/21/2023   CL 102 09/21/2023   CREATININE 0.78 09/21/2023   BUN 10 09/21/2023   CO2 29 09/21/2023   TSH 3.07 05/10/2023    HGBA1C 5.2 05/10/2023         Assessment & Plan:   Physical exam: Screening blood work  ordered Exercise  walking, strength training Weight  obese - working on weight loss Substance abuse  none Had skin check this year  Reviewed recommended immunizations. Tdap today   Health Maintenance  Topic Date Due   DTaP/Tdap/Td (2 - Td or Tdap) 02/13/2024   COVID-19 Vaccine (12 - 2024-25 season) 06/02/2024 (Originally 08/13/2023)   Cervical Cancer Screening (HPV/Pap Cotest)  06/25/2026   Colonoscopy  02/22/2033   HPV VACCINES  Aged Out   Meningococcal B Vaccine  Aged Out   INFLUENZA VACCINE  Discontinued   Hepatitis C Screening  Discontinued   HIV Screening  Discontinued          See Problem List for Assessment and Plan of chronic medical problems.

## 2024-05-17 ENCOUNTER — Ambulatory Visit: Admitting: Internal Medicine

## 2024-05-17 ENCOUNTER — Other Ambulatory Visit

## 2024-05-17 ENCOUNTER — Ambulatory Visit: Payer: Self-pay | Admitting: Internal Medicine

## 2024-05-17 ENCOUNTER — Encounter: Admitting: Nurse Practitioner

## 2024-05-17 VITALS — BP 116/76 | HR 78 | Temp 98.1°F | Ht 64.0 in | Wt 164.0 lb

## 2024-05-17 DIAGNOSIS — R7309 Other abnormal glucose: Secondary | ICD-10-CM | POA: Diagnosis not present

## 2024-05-17 DIAGNOSIS — E559 Vitamin D deficiency, unspecified: Secondary | ICD-10-CM

## 2024-05-17 DIAGNOSIS — E039 Hypothyroidism, unspecified: Secondary | ICD-10-CM | POA: Diagnosis not present

## 2024-05-17 DIAGNOSIS — Z0000 Encounter for general adult medical examination without abnormal findings: Secondary | ICD-10-CM

## 2024-05-17 DIAGNOSIS — J310 Chronic rhinitis: Secondary | ICD-10-CM

## 2024-05-17 DIAGNOSIS — Z23 Encounter for immunization: Secondary | ICD-10-CM

## 2024-05-17 DIAGNOSIS — K219 Gastro-esophageal reflux disease without esophagitis: Secondary | ICD-10-CM

## 2024-05-17 DIAGNOSIS — E66811 Obesity, class 1: Secondary | ICD-10-CM

## 2024-05-17 DIAGNOSIS — Z6828 Body mass index (BMI) 28.0-28.9, adult: Secondary | ICD-10-CM

## 2024-05-17 LAB — COMPREHENSIVE METABOLIC PANEL WITH GFR
ALT: 9 U/L (ref 0–35)
AST: 15 U/L (ref 0–37)
Albumin: 4.5 g/dL (ref 3.5–5.2)
Alkaline Phosphatase: 56 U/L (ref 39–117)
BUN: 13 mg/dL (ref 6–23)
CO2: 31 meq/L (ref 19–32)
Calcium: 9.8 mg/dL (ref 8.4–10.5)
Chloride: 100 meq/L (ref 96–112)
Creatinine, Ser: 0.89 mg/dL (ref 0.40–1.20)
GFR: 77.57 mL/min (ref >60.00)
Glucose, Bld: 76 mg/dL (ref 70–99)
Potassium: 3.5 meq/L (ref 3.5–5.1)
Sodium: 140 meq/L (ref 135–145)
Total Bilirubin: 1.5 mg/dL — ABNORMAL HIGH (ref 0.2–1.2)
Total Protein: 7.6 g/dL (ref 6.0–8.3)

## 2024-05-17 LAB — LIPID PANEL
Cholesterol: 206 mg/dL — ABNORMAL HIGH (ref 0–200)
HDL: 62.3 mg/dL (ref >39.00)
LDL Cholesterol: 126 mg/dL — ABNORMAL HIGH (ref 0–99)
NonHDL: 143.38
Total CHOL/HDL Ratio: 3
Triglycerides: 87 mg/dL (ref 0.0–149.0)
VLDL: 17.4 mg/dL (ref 0.0–40.0)

## 2024-05-17 LAB — CBC WITH DIFFERENTIAL/PLATELET
Basophils Absolute: 0 10*3/uL (ref 0.0–0.1)
Basophils Relative: 0.4 % (ref 0.0–3.0)
Eosinophils Absolute: 0.1 10*3/uL (ref 0.0–0.7)
Eosinophils Relative: 1.8 % (ref 0.0–5.0)
HCT: 43.8 % (ref 36.0–46.0)
Hemoglobin: 14.5 g/dL (ref 12.0–15.0)
Lymphocytes Relative: 26.9 % (ref 12.0–46.0)
Lymphs Abs: 1.9 10*3/uL (ref 0.7–4.0)
MCHC: 33.1 g/dL (ref 30.0–36.0)
MCV: 82.1 fl (ref 78.0–100.0)
Monocytes Absolute: 0.4 10*3/uL (ref 0.1–1.0)
Monocytes Relative: 5.2 % (ref 3.0–12.0)
Neutro Abs: 4.8 10*3/uL (ref 1.4–7.7)
Neutrophils Relative %: 65.7 % (ref 43.0–77.0)
Platelets: 281 10*3/uL (ref 150.0–400.0)
RBC: 5.33 Mil/uL — ABNORMAL HIGH (ref 3.87–5.11)
RDW: 14.2 % (ref 11.5–15.5)
WBC: 7.2 10*3/uL (ref 4.0–10.5)

## 2024-05-17 LAB — VITAMIN D 25 HYDROXY (VIT D DEFICIENCY, FRACTURES): VITD: 64.05 ng/mL (ref 30.00–100.00)

## 2024-05-17 LAB — HEMOGLOBIN A1C: Hgb A1c MFr Bld: 5.3 % (ref 4.6–6.5)

## 2024-05-17 LAB — TSH: TSH: 4.89 u[IU]/mL (ref 0.35–5.50)

## 2024-05-17 MED ORDER — ZEPBOUND 5 MG/0.5ML ~~LOC~~ SOAJ: 5.0000 mg | SUBCUTANEOUS | 0 refills | Status: DC

## 2024-05-17 NOTE — Assessment & Plan Note (Signed)
 Chronic Was on thyroid  medication in the past - no longer on thyroid  medication Check tsh

## 2024-05-17 NOTE — Assessment & Plan Note (Addendum)
 Chronic Currently taking Zepbound  5 mg weekly, which she is tolerating well-did not tolerate 7.5 mg weekly Starting weight 194 pounds-current weight 164 pounds Stressed regular exercise Stressed diet high in protein Continue Zepbound  5 mg weekly to help maintain weight loss

## 2024-05-17 NOTE — Assessment & Plan Note (Signed)
Chronic GERD controlled Continue Pepcid 40 mg daily  

## 2024-05-17 NOTE — Assessment & Plan Note (Signed)
 Chronic Taking vitamin d daily Check vitamin d level

## 2024-05-17 NOTE — Assessment & Plan Note (Signed)
 Chronic Family history of diabetes Check A1c

## 2024-05-17 NOTE — Addendum Note (Signed)
 Addended by: Katherene Pals on: 05/17/2024 01:38 PM   Modules accepted: Orders

## 2024-05-17 NOTE — Assessment & Plan Note (Signed)
 Chronic Controlled Continue fexofenadine 180 mg daily, montelukast  10 mg nightly

## 2024-05-20 ENCOUNTER — Encounter: Payer: Self-pay | Admitting: Internal Medicine

## 2024-06-13 ENCOUNTER — Encounter: Admitting: Nurse Practitioner

## 2024-06-26 ENCOUNTER — Other Ambulatory Visit: Payer: Self-pay | Admitting: Nurse Practitioner

## 2024-06-26 ENCOUNTER — Other Ambulatory Visit: Payer: Self-pay | Admitting: Obstetrics & Gynecology

## 2024-06-26 ENCOUNTER — Other Ambulatory Visit (HOSPITAL_COMMUNITY)
Admission: RE | Admit: 2024-06-26 | Discharge: 2024-06-26 | Disposition: A | Discharge status: Home / Self Care | Source: Ambulatory Visit | Attending: Obstetrics & Gynecology | Admitting: Obstetrics & Gynecology

## 2024-06-26 ENCOUNTER — Encounter: Payer: Self-pay | Admitting: Women's Health

## 2024-06-26 ENCOUNTER — Ambulatory Visit (INDEPENDENT_AMBULATORY_CARE_PROVIDER_SITE_OTHER): Admitting: Obstetrics & Gynecology

## 2024-06-26 VITALS — BP 112/80 | HR 86 | Ht 64.0 in | Wt 164.0 lb

## 2024-06-26 DIAGNOSIS — N939 Abnormal uterine and vaginal bleeding, unspecified: Secondary | ICD-10-CM

## 2024-06-26 DIAGNOSIS — K219 Gastro-esophageal reflux disease without esophagitis: Secondary | ICD-10-CM

## 2024-06-26 DIAGNOSIS — Z87410 Personal history of cervical dysplasia: Secondary | ICD-10-CM | POA: Diagnosis not present

## 2024-06-26 DIAGNOSIS — N959 Unspecified menopausal and perimenopausal disorder: Secondary | ICD-10-CM | POA: Diagnosis not present

## 2024-06-26 DIAGNOSIS — Z01411 Encounter for gynecological examination (general) (routine) with abnormal findings: Secondary | ICD-10-CM | POA: Diagnosis not present

## 2024-06-26 NOTE — Progress Notes (Signed)
 WELL-WOMAN EXAMINATION Patient name: TYNISA VOHS MRN [id number]  Date of birth: 1976-07-24 Chief Complaint:   Gynecologic Exam (Last pap 06-26-23 LSIL)  History of Present Illness:   Nichole Zamora is a 48 y.o. G63P1001 female being seen today for a routine well-woman exam and follow up regarding the following:    Perimenopausal symptoms: At her last visit, we had started Prometrium  to try to assist with sleep and potential improvement of her menses.  While it did help with sleep, she felt very groggy and did not overall like the way she felt.  Additionally it did not change her menses at all.  She did some research on her own regarding herbal supplements for sleep and she is currently taking Magnesium + cherry juice and notes significant improvement.  Rates her sleep symptoms 1/10  Menses are irregular but typically around q 22-28days- on average lasting for about 4 days.  Day 2-3 very heavy and then will completely stop and then come back Day 5 return for another 2 days.  Changing pad about 3x per day.  For her this bleeding is moderate to heavy  H/o cervical dysplasia: 2024- LSIL,HPV neg> colpo CIN 1  Not currently sexually active  Denies vaginal discharge itching or irritation.   Patient's last menstrual period was 06/05/2024.  The current method of family planning is abstinence.    Last pap collected today Last mammogram: 03/2024. Last colonoscopy: 02/2023     06/26/2024    1:30 PM 05/17/2024    8:37 AM 03/21/2024    2:17 PM 01/24/2024    3:56 PM 09/21/2023    3:01 PM  Depression screen PHQ 2/9  Decreased Interest 0 0 0 0 0  Down, Depressed, Hopeless 0 0 0 0 0  PHQ - 2 Score 0 0 0 0 0  Altered sleeping 1 0  2   Tired, decreased energy 0 0  1   Change in appetite 0 0  0   Feeling bad or failure about yourself  0 0  0   Trouble concentrating 0 0  0   Moving slowly or fidgety/restless 0 0  0   Suicidal thoughts 0 0  0   PHQ-9 Score 1 0  3   Difficult doing  work/chores  Not difficult at all         Review of Systems:   Pertinent items are noted in HPI Denies any headaches, blurred vision, fatigue, shortness of breath, chest pain, abdominal pain, bowel movements, urination, or intercourse unless otherwise stated above.  Pertinent History Reviewed:  Reviewed past medical,surgical, social and family history.  Reviewed problem list, medications and allergies. Physical Assessment:   Vitals:   06/26/24 1328  BP: 112/80  Pulse: 86  Weight: 164 lb (74.4 kg)  Height: 5' 4 (1.626 m)  Body mass index is 28.15 kg/m.        Physical Examination:   General appearance - well appearing, and in no distress  Mental status - alert, oriented to person, place, and time  Psych:  She has a normal mood and affect  Skin - warm and dry, normal color, no suspicious lesions noted  Chest - effort normal, all lung fields clear to auscultation bilaterally  Heart - normal rate and regular rhythm  Neck:  midline trachea, no thyromegaly or nodules  Breasts - breasts appear normal, no suspicious masses, no skin or nipple changes or  axillary nodes  Abdomen - soft, nontender, nondistended, no masses or organomegaly  Pelvic - VULVA: normal appearing vulva with no masses, tenderness or lesions  VAGINA: normal appearing vagina with normal color and discharge, no lesions  CERVIX: normal appearing cervix without discharge or lesions, no CMT  Thin prep pap is done with HR HPV cotesting  UTERUS: uterus is felt to be normal size, shape, consistency and nontender   ADNEXA: No adnexal masses or tenderness noted.  Extremities:  No swelling or varicosities noted  Chaperone: Alan Fischer     Assessment & Plan:  1) Well-Woman Exam, h/o cervical dysplasia -pap collected, further management pending results -does not want colposcopy in the future and wishes to consider alternative  2) AUB, perimenopause - No significant change in menses with Prometrium  - Though it did  improve her sleep pattern she had considerable grogginess - Doing better with just herbal supplements in regard to insomnia - While her sleep pattern is better she feels as though her bleeding is a concern that she wishes to address - Reviewed conservative or surgical intervention.  Based on lack of improvement with Prometrium  concern for failed medical management - Patient has done some research on her own and is leaning toward surgical intervention - Will plan for pelvic ultrasound at next available, pending results will likely be considering hysterectomy []  plan for preop once pap and US  completed  Orders Placed This Encounter  Procedures   US  PELVIC COMPLETE WITH TRANSVAGINAL    Meds: No orders of the defined types were placed in this encounter.   Follow-up: Return for pelvic US  next available then appt with me.   Vora Clover, DO Attending Obstetrician & Gynecologist, [facility], [facility]

## 2024-06-27 ENCOUNTER — Other Ambulatory Visit

## 2024-06-27 ENCOUNTER — Encounter: Payer: Self-pay | Admitting: Nurse Practitioner

## 2024-06-27 DIAGNOSIS — S233XXA Sprain of ligaments of thoracic spine, initial encounter: Secondary | ICD-10-CM | POA: Diagnosis not present

## 2024-06-27 DIAGNOSIS — S134XXA Sprain of ligaments of cervical spine, initial encounter: Secondary | ICD-10-CM | POA: Diagnosis not present

## 2024-06-27 DIAGNOSIS — S338XXA Sprain of other parts of lumbar spine and pelvis, initial encounter: Secondary | ICD-10-CM | POA: Diagnosis not present

## 2024-07-01 ENCOUNTER — Ambulatory Visit: Payer: Self-pay | Admitting: Obstetrics & Gynecology

## 2024-07-01 LAB — CYTOLOGY - PAP
Comment: NEGATIVE
Diagnosis: NEGATIVE
High risk HPV: NEGATIVE

## 2024-07-04 ENCOUNTER — Ambulatory Visit
Admission: RE | Admit: 2024-07-04 | Discharge: 2024-07-04 | Disposition: A | Discharge status: Home / Self Care | Source: Ambulatory Visit | Attending: Obstetrics & Gynecology

## 2024-07-04 DIAGNOSIS — N939 Abnormal uterine and vaginal bleeding, unspecified: Secondary | ICD-10-CM

## 2024-07-05 ENCOUNTER — Encounter: Admitting: Nurse Practitioner

## 2024-07-16 ENCOUNTER — Encounter: Payer: Self-pay | Admitting: Obstetrics & Gynecology

## 2024-07-17 ENCOUNTER — Ambulatory Visit: Admitting: Obstetrics & Gynecology

## 2024-07-31 ENCOUNTER — Ambulatory Visit: Admitting: Obstetrics & Gynecology

## 2024-08-14 ENCOUNTER — Encounter: Payer: Self-pay | Admitting: Podiatry

## 2024-08-14 ENCOUNTER — Ambulatory Visit: Admitting: Podiatry

## 2024-08-14 VITALS — Ht 64.0 in | Wt 164.0 lb

## 2024-08-14 DIAGNOSIS — S91201A Unspecified open wound of right great toe with damage to nail, initial encounter: Secondary | ICD-10-CM | POA: Diagnosis not present

## 2024-08-14 NOTE — Progress Notes (Signed)
   Chief Complaint  Patient presents with   Nail Problem    Pt is here due to right great toenail issue, she states 2 months ago dropped a chair on the toe, and damage the toenail, toe does not hurt but the toenail is discolored and lifting off, not sure if the nail needs to be taken off, or will grow out.    HPI: 48 y.o. female presenting today as a new patient for above complaint  Past Medical History:  Diagnosis Date   Essential hypertension, benign 11/12/2019   Bertrum syndrome    HLD (hyperlipidemia) 11/12/2019   Obesity (BMI 30.0-34.9) 11/12/2019   PMS (premenstrual syndrome) 11/12/2019   Thyroid  disease    Vitamin D  deficiency disease 11/12/2019    Past Surgical History:  Procedure Laterality Date   LAMINECTOMY  2012   SEPTOPLASTY     TUBAL LIGATION  2004    Allergies  Allergen Reactions   Codeine Other (See Comments)    Dizziness, Light headed Unknown.      Physical Exam: General: The patient is alert and oriented x3 in no acute distress.  Dermatology: Skin is warm, dry and supple bilateral lower extremities.  Hyperkeratotic thickened discolored nail noted to the right hallux nail plate.  No drainage or concern clinically for infection.  No sensitivity or pain.  There also appears to be some healthy underlying growth of the new nail  Vascular: Palpable pedal pulses bilaterally. Capillary refill within normal limits.  No appreciable edema.  No erythema.  Neurological: Grossly intact via light touch  Musculoskeletal Exam: No pedal deformities noted   Assessment/Plan of Care: 1.  Traumatic injury to right hallux nail plate  -Patient evaluated.   -Currently the right hallux nail plate appears very stable and asymptomatic.  There is no drainage or tenderness to the nail plate.  We will simply observe and allow the new nail to grow in and potentially push out the old damaged nail -Recommend that she returns to the clinic if she begins to experience pain or any  drainage to the area        Thresa EMERSON Sar, DPM [facility]  Dr. Thresa EMERSON Sar, DPM    [address] [place], [place] [address]                Office [phone]  Fax [phone]

## 2024-08-15 ENCOUNTER — Other Ambulatory Visit: Payer: Self-pay | Admitting: Nurse Practitioner

## 2024-08-15 ENCOUNTER — Encounter: Payer: Self-pay | Admitting: Nurse Practitioner

## 2024-08-15 DIAGNOSIS — E66811 Obesity, class 1: Secondary | ICD-10-CM

## 2024-08-15 MED ORDER — ZEPBOUND 5 MG/0.5ML ~~LOC~~ SOAJ: 5.0000 mg | SUBCUTANEOUS | 3 refills | Status: AC

## 2024-08-28 ENCOUNTER — Encounter: Payer: Self-pay | Admitting: Nurse Practitioner

## 2024-08-28 DIAGNOSIS — S338XXA Sprain of other parts of lumbar spine and pelvis, initial encounter: Secondary | ICD-10-CM | POA: Diagnosis not present

## 2024-08-28 DIAGNOSIS — S134XXA Sprain of ligaments of cervical spine, initial encounter: Secondary | ICD-10-CM | POA: Diagnosis not present

## 2024-08-28 DIAGNOSIS — S233XXA Sprain of ligaments of thoracic spine, initial encounter: Secondary | ICD-10-CM | POA: Diagnosis not present

## 2024-08-30 ENCOUNTER — Other Ambulatory Visit: Payer: Self-pay | Admitting: Nurse Practitioner

## 2024-08-30 DIAGNOSIS — N951 Menopausal and female climacteric states: Secondary | ICD-10-CM

## 2024-09-25 ENCOUNTER — Other Ambulatory Visit: Payer: Self-pay | Admitting: Family

## 2024-09-25 DIAGNOSIS — K219 Gastro-esophageal reflux disease without esophagitis: Secondary | ICD-10-CM

## 2024-10-02 ENCOUNTER — Other Ambulatory Visit: Payer: Self-pay

## 2024-10-02 ENCOUNTER — Encounter: Payer: Self-pay | Admitting: Nurse Practitioner

## 2024-10-02 DIAGNOSIS — K219 Gastro-esophageal reflux disease without esophagitis: Secondary | ICD-10-CM

## 2024-10-02 MED ORDER — FAMOTIDINE 40 MG PO TABS: 40.0000 mg | ORAL_TABLET | Freq: Every day | ORAL | 0 refills | Status: AC

## 2024-10-14 DIAGNOSIS — F419 Anxiety disorder, unspecified: Secondary | ICD-10-CM | POA: Diagnosis not present

## 2024-10-23 ENCOUNTER — Other Ambulatory Visit: Payer: Self-pay | Admitting: Nurse Practitioner

## 2024-10-23 DIAGNOSIS — J310 Chronic rhinitis: Secondary | ICD-10-CM

## 2024-10-23 DIAGNOSIS — S233XXA Sprain of ligaments of thoracic spine, initial encounter: Secondary | ICD-10-CM | POA: Diagnosis not present

## 2024-10-23 DIAGNOSIS — S338XXA Sprain of other parts of lumbar spine and pelvis, initial encounter: Secondary | ICD-10-CM | POA: Diagnosis not present

## 2024-10-23 DIAGNOSIS — S134XXA Sprain of ligaments of cervical spine, initial encounter: Secondary | ICD-10-CM | POA: Diagnosis not present

## 2024-11-11 DIAGNOSIS — J0120 Acute ethmoidal sinusitis, unspecified: Secondary | ICD-10-CM | POA: Diagnosis not present

## 2024-11-14 ENCOUNTER — Ambulatory Visit: Admitting: Nurse Practitioner

## 2024-11-14 VITALS — BP 106/78 | HR 91 | Temp 98.0°F | Ht 64.0 in | Wt 166.2 lb

## 2024-11-14 DIAGNOSIS — N951 Menopausal and female climacteric states: Secondary | ICD-10-CM | POA: Diagnosis not present

## 2024-11-14 DIAGNOSIS — E66811 Obesity, class 1: Secondary | ICD-10-CM

## 2024-11-14 DIAGNOSIS — F419 Anxiety disorder, unspecified: Secondary | ICD-10-CM | POA: Diagnosis not present

## 2024-11-14 DIAGNOSIS — F4310 Post-traumatic stress disorder, unspecified: Secondary | ICD-10-CM | POA: Diagnosis not present

## 2024-11-14 LAB — FOLLICLE STIMULATING HORMONE: FSH: 6.6 m[IU]/mL

## 2024-11-14 LAB — LUTEINIZING HORMONE: LH: 5.57 m[IU]/mL

## 2024-11-14 NOTE — Assessment & Plan Note (Signed)
 Labs ordered, further recommendations may be made based upon these results

## 2024-11-14 NOTE — Progress Notes (Signed)
 Established Patient Office Visit  Subjective   Patient ID: Nichole Zamora, female    DOB: 07/13/1976  Age: 48 y.o. MRN: [id number]  Chief Complaint  Patient presents with   Obesity    Discussed the use of AI scribe software for clinical note transcription with the patient, who gave verbal consent to proceed.  History of Present Illness Nichole Zamora is a 48 year old female who presents for management of her weight and medication review.  Obesity and weight trajectory - Treated with Zepbound  for obesity, initiated at 194 lbs (BMI 34.49) - Current weight is 166 lbs (BMI 28.54) - Weight loss has plateaued and there is recent weight regain despite no lifestyle changes - Considering medication adjustment due to insurance notifying patient that it will no longer cover Zepbound  starting in 2026  Glycemic and lipid control - Has prediabetes with peak A1C of 5.7 in 06/2022.  - Last hemoglobin A1c was 5.3%.        Review of Systems  Respiratory:  Negative for shortness of breath.   Cardiovascular:  Negative for chest pain.      Objective:     BP 106/78   Pulse 91   Temp 98 F (36.7 C) (Temporal)   Ht 5' 4 (1.626 m)   Wt 166 lb 4 oz (75.4 kg)   SpO2 98%   BMI 28.54 kg/m  BP Readings from Last 3 Encounters:  11/14/24 106/78  06/26/24 112/80  05/17/24 116/76   Wt Readings from Last 3 Encounters:  11/14/24 166 lb 4 oz (75.4 kg)  08/14/24 164 lb (74.4 kg)  06/26/24 164 lb (74.4 kg)      Physical Exam Vitals reviewed.  Constitutional:      General: She is not in acute distress.    Appearance: Normal appearance.  HENT:     Head: Normocephalic and atraumatic.  Cardiovascular:     Rate and Rhythm: Normal rate and regular rhythm.     Pulses: Normal pulses.     Heart sounds: Normal heart sounds.  Pulmonary:     Effort: Pulmonary effort is normal.     Breath sounds: Normal breath sounds.  Skin:    General: Skin is warm and dry.  Neurological:      General: No focal deficit present.     Mental Status: She is alert and oriented to person, place, and time.  Psychiatric:        Mood and Affect: Mood normal.        Behavior: Behavior normal.        Judgment: Judgment normal.      No results found for any visits on 11/14/24.    The 10-year ASCVD risk score (Arnett DK, et al., 2019) is: 0.8%    Assessment & Plan:   Problem List Items Addressed This Visit       Other   Obesity (BMI 30.0-34.9) - Primary   Other Visit Diagnoses       Perimenopause          Assessment and Plan Assessment & Plan Obesity with insulin  resistance Managed with Zepbound . Current weight 166 lbs, BMI 28.54. Previous weight 194 lbs, BMI 34.49. Concerns about insurance coverage for Zepbound . Discussed metformin as a less effective alternative. Considered compounded medications but not recommended due to safety issues. Discussed Zepbound  7.5 mg every other week to extend supply. - Continue Zepbound  7.5 mg every other week to extend supply. - Consider metformin if Zepbound  is not effective  or becomes unavailable. - Avoid compounded medications due to safety concerns. - Monitor weight and adjust treatment as needed.  Polycystic ovary syndrome (suspected) Suspected PCOS with irregular periods and hormonal imbalances. Previous ultrasound showed atrophied ovaries per patient report.  Discussed metformin for insulin  resistance management. - Continue to monitor symptoms and consider metformin if needed for insulin  resistance.    Return in about 6 months (around 05/15/2025) for CPE with Henley Boettner.    Lauraine FORBES Pereyra, NP

## 2024-11-14 NOTE — Assessment & Plan Note (Signed)
 Obesity with insulin  resistance Managed with Zepbound . Current weight 166 lbs, BMI 28.54. Previous weight 194 lbs, BMI 34.49. Concerns about insurance coverage for Zepbound . Discussed metformin as a less effective alternative. Considered compounded medications but not recommended due to safety issues. Discussed Zepbound  7.5 mg every other week to extend supply. - Continue Zepbound  7.5 mg every other week to extend supply. - Consider metformin if Zepbound  is not effective or becomes unavailable. - Avoid compounded medications due to safety concerns. - Monitor weight and adjust treatment as needed.

## 2024-11-20 LAB — PROGESTERONE: Progesterone: 14.1 ng/mL

## 2024-11-20 LAB — ESTROGENS, TOTAL: Estrogen: 265 pg/mL

## 2024-11-21 ENCOUNTER — Ambulatory Visit: Admitting: Nurse Practitioner

## 2024-12-24 ENCOUNTER — Encounter: Payer: Self-pay | Admitting: Nurse Practitioner

## 2024-12-26 ENCOUNTER — Other Ambulatory Visit: Payer: Self-pay | Admitting: Nurse Practitioner

## 2024-12-26 DIAGNOSIS — G4700 Insomnia, unspecified: Secondary | ICD-10-CM

## 2024-12-26 DIAGNOSIS — R5383 Other fatigue: Secondary | ICD-10-CM

## 2024-12-26 DIAGNOSIS — E66811 Obesity, class 1: Secondary | ICD-10-CM

## 2025-02-04 ENCOUNTER — Institutional Professional Consult (permissible substitution): Admitting: Neurology

## 2025-05-22 ENCOUNTER — Encounter: Admitting: Nurse Practitioner
